# Patient Record
Sex: Female | Born: 1970 | Race: White | Hispanic: No | Marital: Married | Smoking: Former smoker
Health system: Southern US, Community
[De-identification: ages and names within clinical notes are randomized; demographics above are authoritative.]

## PROBLEM LIST (undated history)

## (undated) DIAGNOSIS — K529 Noninfective gastroenteritis and colitis, unspecified: Secondary | ICD-10-CM

## (undated) DIAGNOSIS — K8020 Calculus of gallbladder without cholecystitis without obstruction: Secondary | ICD-10-CM

## (undated) DIAGNOSIS — K219 Gastro-esophageal reflux disease without esophagitis: Secondary | ICD-10-CM

## (undated) DIAGNOSIS — G9332 Myalgic encephalomyelitis/chronic fatigue syndrome: Secondary | ICD-10-CM

## (undated) DIAGNOSIS — R7989 Other specified abnormal findings of blood chemistry: Secondary | ICD-10-CM

## (undated) DIAGNOSIS — Z8619 Personal history of other infectious and parasitic diseases: Secondary | ICD-10-CM

## (undated) DIAGNOSIS — F509 Eating disorder, unspecified: Secondary | ICD-10-CM

## (undated) DIAGNOSIS — F329 Major depressive disorder, single episode, unspecified: Secondary | ICD-10-CM

## (undated) DIAGNOSIS — F32A Depression, unspecified: Secondary | ICD-10-CM

## (undated) DIAGNOSIS — K921 Melena: Secondary | ICD-10-CM

## (undated) DIAGNOSIS — J302 Other seasonal allergic rhinitis: Secondary | ICD-10-CM

## (undated) DIAGNOSIS — Z720 Tobacco use: Secondary | ICD-10-CM

## (undated) DIAGNOSIS — K625 Hemorrhage of anus and rectum: Secondary | ICD-10-CM

## (undated) DIAGNOSIS — R5382 Chronic fatigue, unspecified: Secondary | ICD-10-CM

## (undated) DIAGNOSIS — E2839 Other primary ovarian failure: Secondary | ICD-10-CM

## (undated) DIAGNOSIS — F411 Generalized anxiety disorder: Secondary | ICD-10-CM

## (undated) DIAGNOSIS — D369 Benign neoplasm, unspecified site: Secondary | ICD-10-CM

## (undated) DIAGNOSIS — F319 Bipolar disorder, unspecified: Secondary | ICD-10-CM

## (undated) HISTORY — DX: Benign neoplasm, unspecified site: D36.9

## (undated) HISTORY — DX: Hemorrhage of anus and rectum: K62.5

## (undated) HISTORY — PX: BARTHOLIN GLAND CYST EXCISION: SHX565

## (undated) HISTORY — DX: Other primary ovarian failure: E28.39

## (undated) HISTORY — DX: Bipolar disorder, unspecified: F31.9

## (undated) HISTORY — DX: Tobacco use: Z72.0

## (undated) HISTORY — DX: Myalgic encephalomyelitis/chronic fatigue syndrome: G93.32

## (undated) HISTORY — PX: ABDOMINOPLASTY: SUR9

## (undated) HISTORY — DX: Personal history of other infectious and parasitic diseases: Z86.19

## (undated) HISTORY — DX: Melena: K92.1

## (undated) HISTORY — PX: ABDOMINAL HYSTERECTOMY: SHX81

## (undated) HISTORY — DX: Other specified abnormal findings of blood chemistry: R79.89

## (undated) HISTORY — PX: TONSILLECTOMY: SUR1361

## (undated) HISTORY — DX: Generalized anxiety disorder: F41.1

## (undated) HISTORY — DX: Depression, unspecified: F32.A

## (undated) HISTORY — DX: Chronic fatigue, unspecified: R53.82

## (undated) HISTORY — DX: Other seasonal allergic rhinitis: J30.2

## (undated) HISTORY — DX: Gastro-esophageal reflux disease without esophagitis: K21.9

## (undated) HISTORY — DX: Major depressive disorder, single episode, unspecified: F32.9

## (undated) HISTORY — DX: Eating disorder, unspecified: F50.9

---

## 1999-04-14 ENCOUNTER — Encounter: Payer: Self-pay | Admitting: Family Medicine

## 1999-04-14 ENCOUNTER — Emergency Department (HOSPITAL_COMMUNITY): Admission: EM | Admit: 1999-04-14 | Discharge: 1999-04-14 | Payer: Self-pay | Admitting: Emergency Medicine

## 1999-09-16 ENCOUNTER — Encounter: Admission: RE | Admit: 1999-09-16 | Discharge: 1999-09-16 | Payer: Self-pay | Admitting: *Deleted

## 1999-10-07 ENCOUNTER — Emergency Department (HOSPITAL_COMMUNITY): Admission: EM | Admit: 1999-10-07 | Discharge: 1999-10-07 | Payer: Self-pay | Admitting: *Deleted

## 2000-02-08 ENCOUNTER — Inpatient Hospital Stay (HOSPITAL_COMMUNITY): Admission: EM | Admit: 2000-02-08 | Discharge: 2000-02-14 | Payer: Self-pay | Admitting: Psychiatry

## 2002-04-01 ENCOUNTER — Ambulatory Visit (HOSPITAL_COMMUNITY): Admission: RE | Admit: 2002-04-01 | Discharge: 2002-04-01 | Payer: Self-pay

## 2002-04-01 ENCOUNTER — Encounter (INDEPENDENT_AMBULATORY_CARE_PROVIDER_SITE_OTHER): Payer: Self-pay | Admitting: Specialist

## 2002-04-27 ENCOUNTER — Encounter (INDEPENDENT_AMBULATORY_CARE_PROVIDER_SITE_OTHER): Payer: Self-pay

## 2002-04-27 ENCOUNTER — Ambulatory Visit (HOSPITAL_COMMUNITY): Admission: AD | Admit: 2002-04-27 | Discharge: 2002-04-27 | Payer: Self-pay

## 2002-08-27 ENCOUNTER — Other Ambulatory Visit: Admission: RE | Admit: 2002-08-27 | Discharge: 2002-08-27 | Payer: Self-pay

## 2008-10-10 ENCOUNTER — Ambulatory Visit (HOSPITAL_COMMUNITY): Admission: RE | Admit: 2008-10-10 | Discharge: 2008-10-10 | Payer: Self-pay | Admitting: Gynecology

## 2008-10-23 ENCOUNTER — Inpatient Hospital Stay (HOSPITAL_COMMUNITY): Admission: RE | Admit: 2008-10-23 | Discharge: 2008-10-24 | Payer: Self-pay | Admitting: Obstetrics and Gynecology

## 2008-10-23 ENCOUNTER — Encounter (INDEPENDENT_AMBULATORY_CARE_PROVIDER_SITE_OTHER): Payer: Self-pay | Admitting: Obstetrics and Gynecology

## 2008-10-23 HISTORY — PX: ABDOMINAL HYSTERECTOMY: SHX81

## 2010-07-12 IMAGING — CT CT PELVIS W/ CM
3 of 5 series · 14 of 32 positions shown, 19 images · IV contrast ([ID] READICAT & [ID] OMNIP 300%)
Comparison: None

CT ABDOMEN

CLINICAL DATA: Indeterminate pelvic mass seen on office
ultrasound.  Diffuse abdominal and pelvic distention and pain.

CT ABDOMEN AND PELVIS WITH CONTRAST
TECHNIQUE: Multidetector CT imaging of the abdomen and pelvis was
performed using the standard protocol following bolus
administration of intravenous contrast.
Contrast: 100 ml 1mnipaque-C44 and oral contrast

[Series 2: abd pelvis · axial · 0.71mm/px · z∈[-436,-182]mm · 4 of 86 slices shown, 9 images]
[im 18/86  soft-tissue]
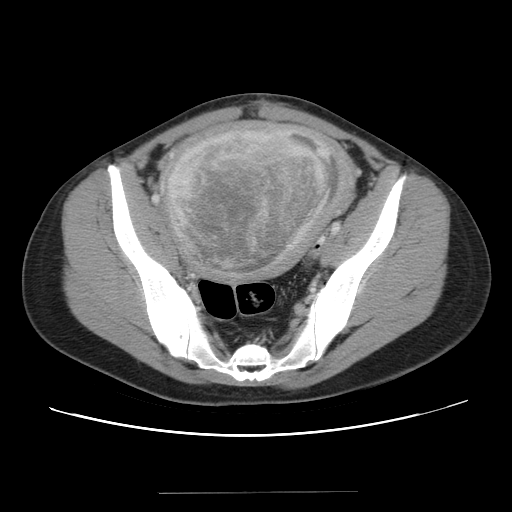
[im 18/86  lung]
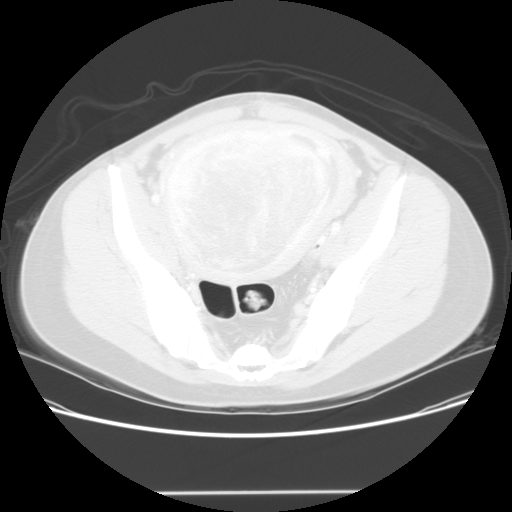
[im 18/86  bone]
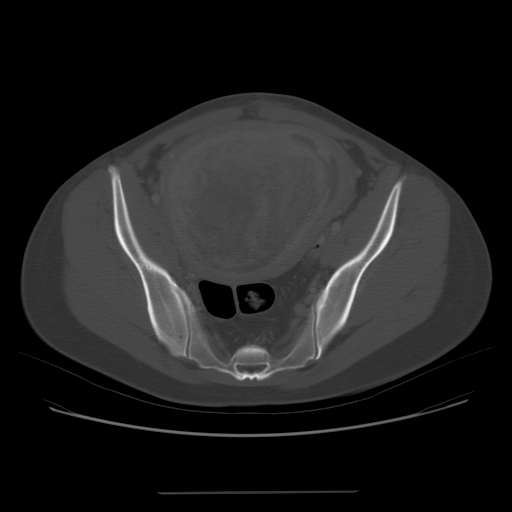
[im 35/86  soft-tissue]
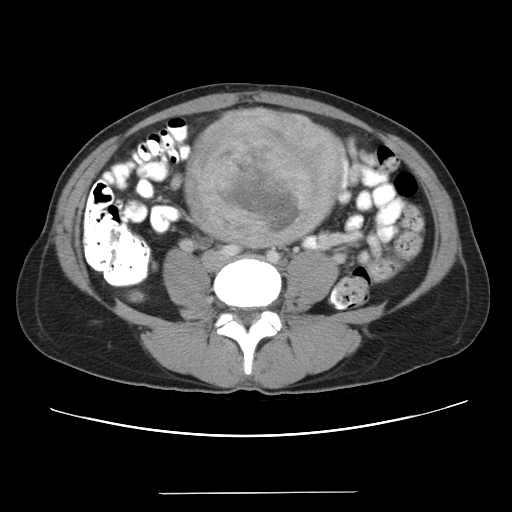
[im 35/86  lung]
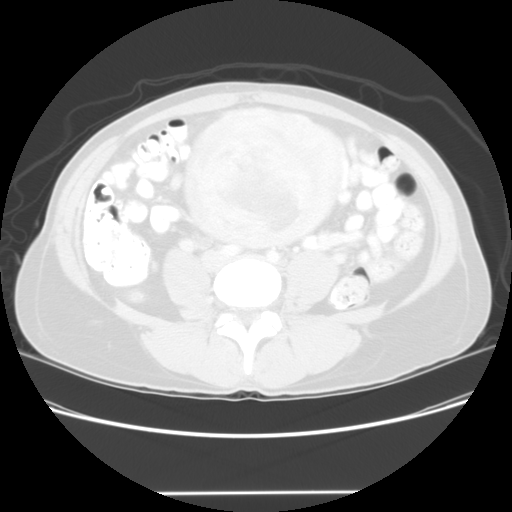
[im 52/86  soft-tissue]
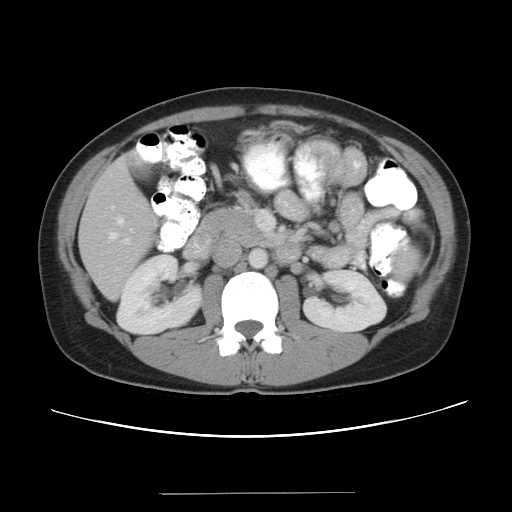
[im 52/86  lung]
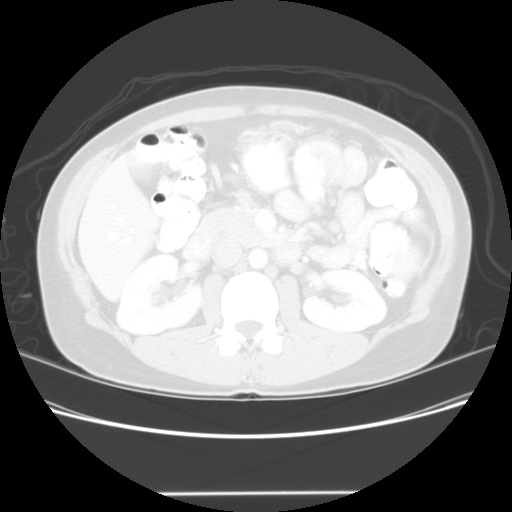
[im 69/86  soft-tissue]
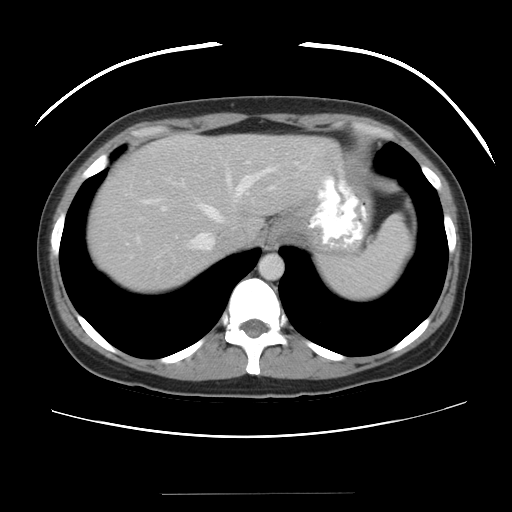
[im 69/86  lung]
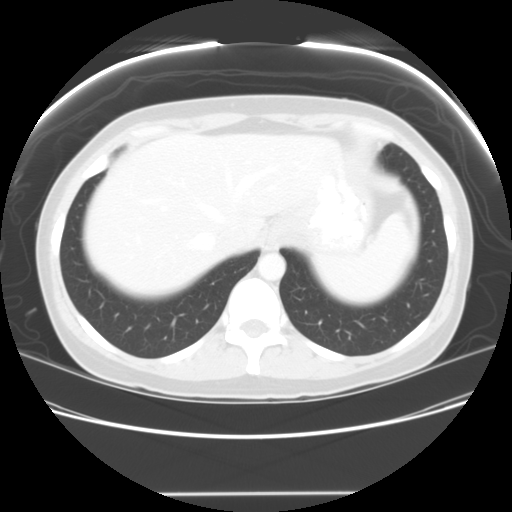

[Series 400: reformatted · coronal · 0.92mm/px · 2 of 131 slices shown (1 of 2)]
[im 15/131  soft-tissue]
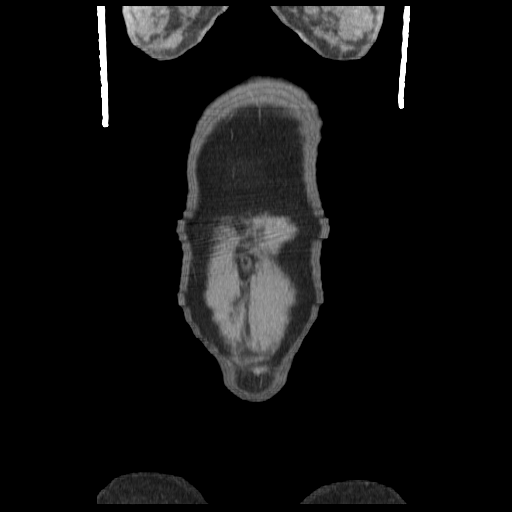
[im 29/131  soft-tissue]
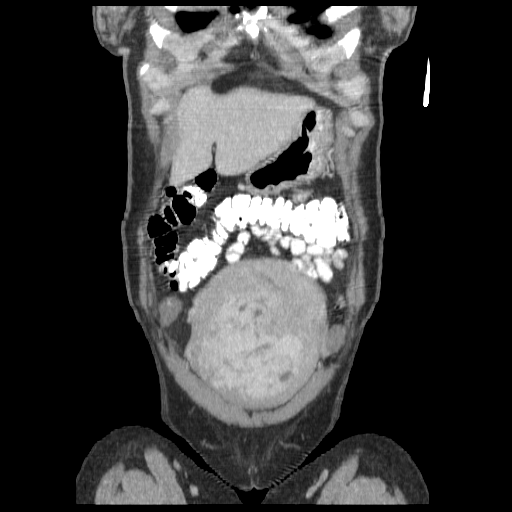

[Series 401: reformatted · sagittal · 0.92mm/px · 8 of 146 slices shown (2 of 2)]
[im 15/146  soft-tissue]
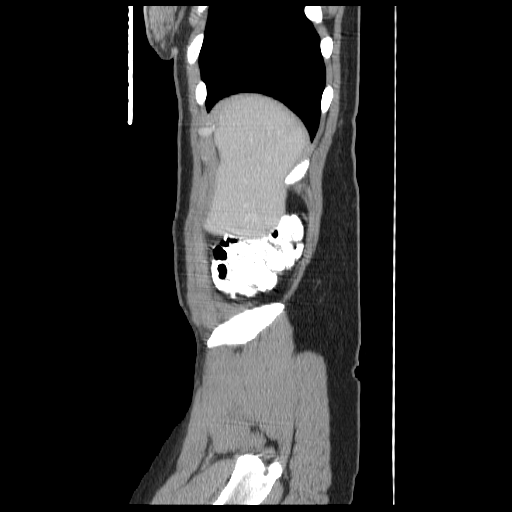
[im 30/146  soft-tissue]
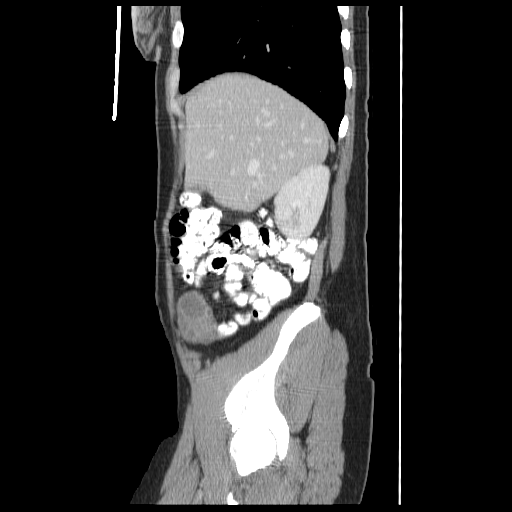
[im 44/146  soft-tissue]
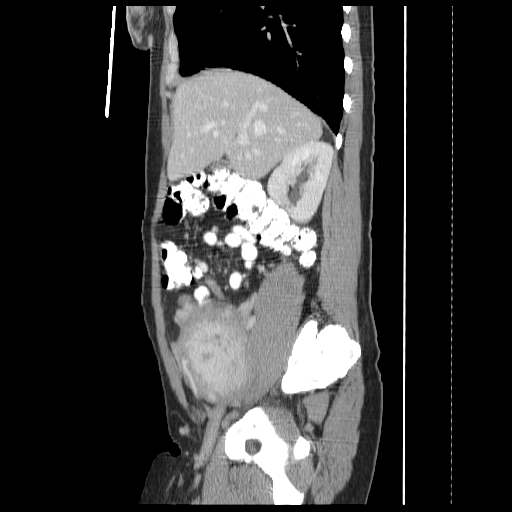
[im 59/146  soft-tissue]
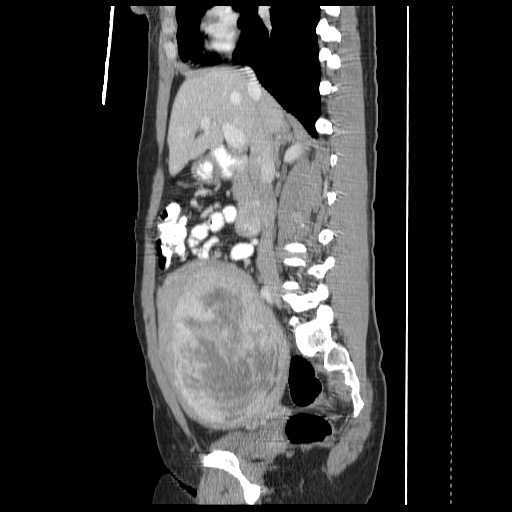
[im 88/146  soft-tissue]
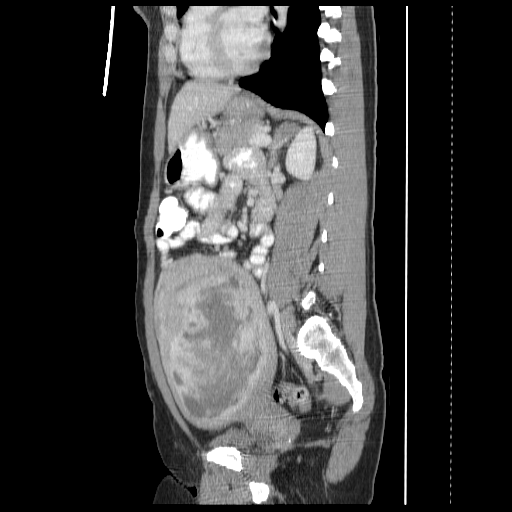
[im 102/146  soft-tissue]
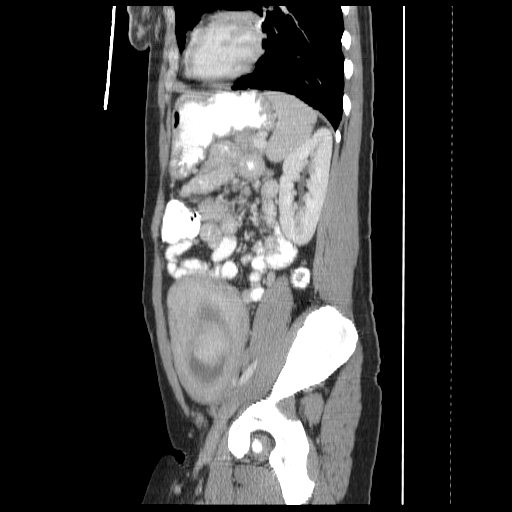
[im 117/146  soft-tissue]
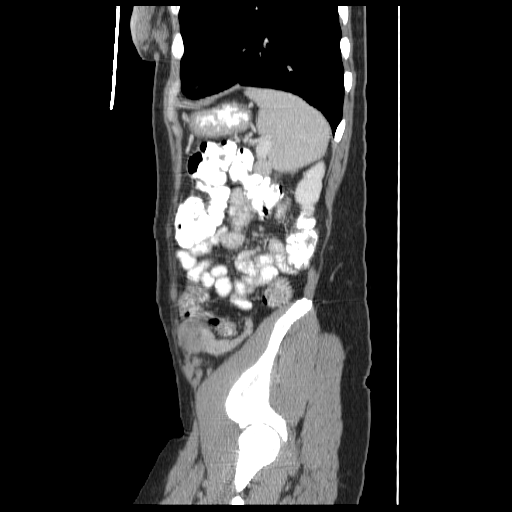
[im 131/146  soft-tissue]
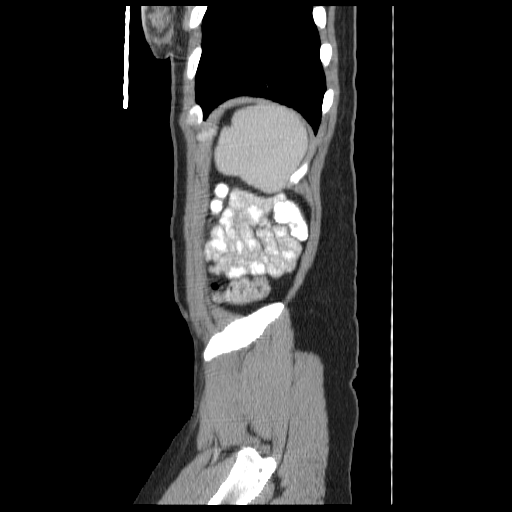

[14 of 32 positions shown; findings below may reference images not displayed]

FINDINGS: The abdominal parenchymal organs are normal in
appearance.  There is no evidence of hydronephrosis.  Small
calcified gallstones are seen, however there is no evidence of
acute cholecystitis or biliary ductal dilatation.

No abdominal soft tissue masses are identified.  No adenopathy is
seen within the abdomen.  There is no evidence of inflammatory
process or abnormal fluid collections.
IMPRESSION: 1.  No acute findings within the upper abdomen.
2.  Cholelithiasis.

CT PELVIS
FINDINGS: A large heterogeneously enhancing mass is seen within
the central uterus.  This measures 12.7 x 11.1 cm.  This could
represent a large submucosal fibroid versus endometrial mass.

A smaller low attenuation mass is seen in the anterior myometrial
wall of the uterine body measuring 2.8 cm, consistent with a small
intramural fibroid.

A 2.7 cm simple right ovarian cyst is seen, however ovaries
otherwise unremarkable appearance.  There is no evidence of pelvic
lymphadenopathy.  There is no evidence of inflammatory process or
ascites.  There is no evidence of dilated bowel loops.
IMPRESSION: 1.  12.7 cm heterogeneously enhancing mass within the central
uterus.  Differential diagnosis includes large submucosal fibroid
versus endometrial mass.  Pelvic MRI has greater soft tissue
contrast and specificity for GYN organs than CT, and could be
performed for further evaluation if desired.
2.  2.7 cm simple right ovarian cyst.
3.  No evidence of pelvic lymphadenopathy or ascites.

## 2010-10-13 LAB — BASIC METABOLIC PANEL
BUN: 9 mg/dL (ref 6–23)
CO2: 26 mEq/L (ref 19–32)
Calcium: 8.7 mg/dL (ref 8.4–10.5)
Chloride: 100 mEq/L (ref 96–112)
Creatinine, Ser: 0.83 mg/dL (ref 0.4–1.2)
GFR calc Af Amer: >60 mL/min (ref >60)
GFR calc non Af Amer: >60 mL/min (ref >60)
Glucose, Bld: 87 mg/dL (ref 70–99)
Potassium: 3.5 mEq/L (ref 3.5–5.1)
Sodium: 134 mEq/L — ABNORMAL LOW (ref 135–145)

## 2010-10-13 LAB — CBC
HCT: 17.1 % — ABNORMAL LOW (ref 36.0–46.0)
HCT: 26.6 % — ABNORMAL LOW (ref 36.0–46.0)
Hemoglobin: 5.8 g/dL — CL (ref 12.0–15.0)
Hemoglobin: 8.9 g/dL — ABNORMAL LOW (ref 12.0–15.0)
MCHC: 33.7 g/dL (ref 30.0–36.0)
MCV: 78.9 fL (ref 78.0–100.0)
MCV: 79.2 fL (ref 78.0–100.0)
RBC: 3.35 MIL/uL — ABNORMAL LOW (ref 3.87–5.11)
RDW: 14.9 % (ref 11.5–15.5)
WBC: 5.4 10*3/uL (ref 4.0–10.5)

## 2010-11-16 NOTE — Op Note (Signed)
NAME:  Samantha Lyons, Samantha Lyons             ACCOUNT NO.:  [id number]   MEDICAL RECORD NO.:  [id number]          PATIENT TYPE:  INP   LOCATION:  9306                          [facility]ACILITY:  [facility]   PHYSICIAN:  Michelle L. Grewal, M.D.DATE OF BIRTH:  1970-07-21   DATE OF PROCEDURE:  10/23/2008  DATE OF DISCHARGE:                               OPERATIVE REPORT   PREOPERATIVE DIAGNOSES:  Pelvic mass and anemia.   POSTOPERATIVE DIAGNOSES:  Fibroids and anemia.   PROCEDURE:  Total abdominal hysterectomy and bilateral salpingo-  oophorectomy.   SURGEON:  Michelle L. Vincente Poli, MD   ASSISTANT:  Zelphia Cairo, MD   ANESTHESIA:  General.   ESTIMATED BLOOD LOSS:  300 mL.   DRAINS:  Foley catheter.   COMPLICATIONS:  None.   PATHOLOGY:  Uterus, cervix, bilateral fallopian tubes, and bilateral  ovaries.   PROCEDURE:  The patient is a 40 year old patient who was consented in  the office after a pelvic mass was found on exam.  She was counseled  that she needed a TAH possible BSO.  The patient elected to have a BSO  at the time of surgery.  She had been counseled on the risk of surgery  which include infection, bleeding, anesthesia, pulmonary embolism, deep  venous thromboembolism, necessity for blood transfusion, or injury to  internal organs, she agreed to proceed with the surgery.  Her hemoglobin  preop was 8.9 and she was advised she may need 1-2 packed units of cells  after surgery depending on how she tolerates a lower hemoglobin and  depending on the blood loss at the time of surgery, she had agreed to  possible blood transfusion.  The patient was intubated without  difficulty.  She was then prepped and draped in the usual sterile  fashion.  A Foley catheter was inserted and draining clear urine.  Exam  under anesthesia revealed a mass that extended above the umbilicus, she  had a previous abdominoplasty scar.  A scalpel was used to make a low  incision and I used the exact same incision as the  abdominal fossa, the  scrub did not extend it to full width, it was carried down to the  fascia, the fascia was scored in the midline extended laterally.  The  rectus muscles were separated in the midline.  The peritoneum was  entered bluntly.  It was noted that she had a large uterus with a large  fibroid.  Her ovaries were normal.  The uterus was then pulled up  through the incision.  We then started with the hysterectomy.  Then, I  placed curved Heaney clamps just beneath the right ovary across the  infundibular pelvic ligament.  The ureter was lay low in the pelvis.  The pedicles cut and suture ligated using 0 Vicryl suture.  We then  placed a curved Heaney clamps just along the broad ligament.  There was  a large fibroid that was extended and out into the right side of the  broad ligament and each pedicle was clamped, cut, suture ligated using 0  Vicryl suture. We then identified the round ligament on the  right side  that was suture ligated using 0 Vicryl suture and then the bladder flap  was created without difficulty using sharp and blunt dissection.  A  curved Heaney clamp was then placed across the remainder of the broad  ligament across to the uterine artery at the level of the internal os.  The pedicle was clamped, cut, and suture ligated using 0 Vicryl suture.  This was done identically on the left side as well.  At this point, once  we reached the level of the internal os on both sides, we then amputated  the large uterus, so we could see the cervix.  This was done without  difficulty.  Then, we placed a self-retaining retractor in the abdominal  cavity and placed laparotomy pads in the upper abdomen.  We then grasped  the cervix using a Kocher clamp.  We then further developed the bladder  flap which was easily done in the cervix, this was very short.  I placed  a straight Heaney clamp and then a curved Heaney clamps and that reached  the external os.  Each pedicle was  clamped, cut, and suture ligated  using 0 Vicryl suture.  This was done identically on the left side as  well.  The cervix was then removed, the vaginal cuff was closed with 3  figure-of-eights using 0 Vicryl suture.  Irrigation was performed.  Hemostasis at all pedicle sites were excellent.  All laparotomy pads and  retractors were removed without difficulty.  The peritoneum was closed  using 0 Vicryl in a running stitch.  The  rectus muscles were  reapproximated using the same 0 Vicryl.  The fascia was closed using 0  Vicryl in a running stitch.  After irrigation of subcutaneous layer, the  skin was closed with staples.  All sponge, lap, and instrument counts  were correct x2.  The patient went to recovery room in stable condition.      Michelle L. Vincente Poli, M.D.  Electronically Signed     MLG/MEDQ  D:  10/23/2008  T:  10/23/2008  Job:  [id number]

## 2010-11-16 NOTE — H&P (Signed)
NAME:  Samantha Lyons, Samantha Lyons             ACCOUNT NO.:  [id number]   MEDICAL RECORD NO.:  [id number]          PATIENT TYPE:  AMB   LOCATION:  SDC                           [facility]ILITY:  [facility]   PHYSICIAN:  Michelle L. Grewal, M.D.DATE OF BIRTH:  Nov 22, 1970   DATE OF ADMISSION:  DATE OF DISCHARGE:                              HISTORY & PHYSICAL   Date of surgery is October 23, 2008   HISTORY OF PRESENT ILLNESS:  The patient is a 40 year old gravida 2,  para 2, LMP 10/11/2008, presents today for hysterectomy.  She has a known  pelvic mass that has been present for at least 4 years.  She has not  been sexually active for 2 years.  She has a history of cervical  dysplasia at age 35.  She has not had any GYN care for about 10 years.  She has noticed this enlarging pelvic mass, since birth of her twins.  She had abdominoplasty 6 years ago.  She denies any abnormal bleeding,  any nausea or vomiting.  CT scan was performed and ordered by Dr. Chevis Pretty  on October 10, 2008 that showed a large heterogeneous enhancing mass seen  within the central uterus.  It measured 12.7 x 11.1 cm.  It could  represent a large submucosal fibroid versus endometrial mass.  A smaller  mass is seen in the anterior myometrial wall of the uterine body  measuring 2.8 cm consistent with a small intramural fibroid.  Ovaries  were unremarkable.  There was no pelvic lymphadenopathy.  No evidence of  inflammatory process or ascites and no evidence of dilated bowel loops.  The patient was counseled by Dr. Chevis Pretty and then by myself with the need  for hysterectomy.  She wants to proceed.   PAST MEDICAL HISTORY:  She is unremarkable.  She has a history of  abnormal Pap smear and urinary tract infections.   SURGICAL HISTORY:  Abdominoplasty and delivery of twins.   MEDICATIONS:  She is not on any medications.   ALLERGIES:  She has no known allergies.   SOCIAL HISTORY:  She does smoke a one-half pack per day.  She does drink  1-2 alcoholic  drinks per week.  She is divorced and she has not been  sexually active for about 2 years.   PHYSICAL EXAMINATION:  VITAL SIGNS:  Weight 144 and BP 104/68.  LUNGS:  Clear to auscultation bilaterally.  CARDIAC:  Regular rate and rhythm.  BREASTS:  Soft, nontender, and no masses.  ABDOMEN:  She is very thin and there is a palpable protuberant mass to  the umbilicus and it is very firm to palpation.  PELVIC:  Deferred.   IMPRESSION:  Pelvic mass.   PLAN:  I do recommend a total abdominal hysterectomy.  She of course,  had a hemoglobin on October 06, 2008 that was 9.5 and her Pap smear on  October 06, 2008 is within normal limits.  At the same time, she wishes to  have a BSO because of strong family history of endometriosis.  She is  well aware of the potential need for hormone therapy postop.  She was  counseled by myself as well as by Dr. Chevis Pretty to separate visit with a  known risk associated with  hysterectomy such as risks to injury to internal organs, risk of  anesthesia, risk of infection, risk of bleeding, and risk of possible  need for a blood transfusion and possible risk of pulmonary embolism and  deep venous thromboembolism.  We will proceed with a TAH-BSO.      Michelle L. Vincente Poli, M.D.  Electronically Signed     MLG/MEDQ  D:  10/22/2008  T:  10/22/2008  Job:  [id number]

## 2010-11-19 NOTE — Op Note (Signed)
   NAME:  Samantha Lyons, Samantha Lyons                       ACCOUNT NO.:  [id number]   MEDICAL RECORD NO.:  [id number]                   PATIENT TYPE:  AMB   LOCATION:  SDC                                  [facility]   PHYSICIAN:  James A. Ashley Royalty, M.D.             DATE OF BIRTH:  20-Jul-1970   DATE OF PROCEDURE:  DATE OF DISCHARGE:  04/27/2002                                 OPERATIVE REPORT   PREOPERATIVE DIAGNOSES:  Inevitable abortion.   POSTOPERATIVE DIAGNOSES:  Inevitable abortion.   PATHOLOGY:  Pending.   PROCEDURE:  Suction dilatation and curettage.   SURGEON:  Rudy Jew. Ashley Royalty, M.D.   ANESTHESIA:  Monitored anesthesia care with 1% Xylocaine paracervical block  (20 cc).   FINDINGS:  Uterus sounded to approximately 9 cm.  A moderate amount of  apparent products of conception was obtained.   ESTIMATED BLOOD LOSS:  75 cc.   COMPLICATIONS:  None.   PACKS AND DRAINS:  None.   TYPE AND RH:  A+.   PROCEDURE:  The patient was taken to the operating room and placed in the  dorsal supine position.  After adequate IV sedation was administered, she  was placed in the lithotomy position and prepped and draped in the usual  manner for vaginal surgery.  Posterior weighted retractor was placed per  vagina.  The anterior lip of the cervix was grasped with a single tooth  tenaculum.  20 cc of 1% Xylocaine was instilled into the cervix to create a  paracervical block.  The uterus was then gently sounded to approximately 9  cm.  The uterus was verified as already being dilated to 29 French diameter  using 29 French dilator.  A 9 mm suction curette was introduced into the  uterine cavity.  Suction was applied.  A moderate amount of apparent  products of conception was delivered through the tubing.  After several  passes with the suction curette, no additional tissue was obtained.  At this  point gentle sharp curettage was performed.  Careful attention was paid to  avoid over zealous  curettage.  One additional pass with the  suction curette yielded no additional tissue.  At this point the procedure  was terminated.  The vaginal instruments were removed.  Hemostasis was  noted.  The patient was taken to the recovery room in excellent condition.  Type and Rh A+.                                               James A. Ashley Royalty, M.D.    JAM/MEDQ  D:  04/27/2002  T:  04/29/2002  Job:  [id number]   cc:   Ronda Fairly. Galen Daft, M.D.

## 2010-11-19 NOTE — H&P (Signed)
[facility]  Patient:    Samantha Lyons, Samantha Lyons                     MRN: [id number] Adm. Date:  [id number] Attending:  Denny Peon                   Psychiatric Admission Assessment  INTRODUCTION:  Samantha Lyons is a 40 year old white single female mother of 35-month-old twins, who at present, lives with parents.  PRESENTING PROBLEMS:  Patient was committed by her father after allegedly expressing suicidal idea, throwing angry fits, punching walls, and pushing him.  According to patient, commitment was parents way to prevent her from going to [place] to see the father of her twins, whom she hopes to persuade to help her with child support.  Unfortunately, the _________ shows that it was an example of very poor judgment, since the man does not care about her children, lives with another woman, and is involved in a pretty hostile environment.  Parents wanted to protect the patients children from being exposed to unreasonably long trip in the car and from being exposed to possible violence in [place].  Patient is supposed to be seen recently by the local mental health center, but she tends to miss appointments.  She was on some medication prescribed by emergency room doctor, Dr. Katrinka Blazing, which included Klonopin at bedtime, Seroquel 25 mg at bedtime, and Atarol.  She was taking 5 mg of Atarol in the morning.  Patient reports sleeping 4-6 hours, denies racing pulse, denies hallucinations, delusions, or dangerous ideations. Her behavior was, however, somewhat erratic, speech fast and pressured, and she seemed to be on the edge of breaking down.  She admitted that her parents get on her nerves, but she told me that she can stay totally composed. Patient has history of several hospitalizations in the past, last time at [place] two and a half years ago, discharged with diagnosis of bipolar disorder on Depakote, but never followed.  She was previously seen on  outpatient basis by Dr. Jodi Marble with diagnosis of ADD, panic disorder, and sleep disorder.  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  FAMILY HISTORY:  Negative for mental problems.  SUBSTANCE ABUSE:  Patient smokes marijuana daily and smokes cigarettes.  She denies any stronger drugs or alcohol.  MEDICAL PROBLEMS:  Last menstrual period was three days ago.  She does not take birth control pills, but denied being sexually active.  PHYSICAL EXAMINATION:  Physical examination in emergency room was normal three days ago.  MENTAL STATUS EXAMINATION:  Patient is thin built white female with fair personal hygiene, good eye contact, somehow hyper in her behavior, restless, but cooperative.  She has a hard time paying attention.  Speech was fast but not pressured.  Mood was okay, complains of being anxious over being here. _________ were moving with fast pace with frequent circumstantial digression, requiring redirecting.  Attention span was grossly disturbed.  No dangerous ideations, no delusions, no obsession or compulsions.  Patient was alert, oriented x 3, with fair memory, but grossly decreased concentration.  Her insight was poor, judgment disturbed, and intellectual functioning on average range.  DIAGNOSTIC IMPRESSION: Axis I:    1. Bipolar disorder, not otherwise specified.            2. Attention deficit disorder.  3. Sleep disorder, not otherwise specified.            4. Marijuana abuse. Axis II:   Personality disorder, not otherwise specified. Axis III:  No diagnosis. Axis IV:   Moderate stressors. Axis V:    GAF at the time of admission 14, discharge 55.  PLAN:  Will include meeting with patients family and friend, starting her on mood stabilizer, and offering patient voluntary papers.  After stable, she could be satisfactorily discharged to  community, likely on outpatient commitment.  At this point she agrees with the plan.  DD:  02/10/00 TD:  02/12/00 Job: [id number]

## 2010-11-19 NOTE — Op Note (Signed)
   NAME:  Samantha Lyons, Samantha Lyons                       ACCOUNT NO.:  [id number]   MEDICAL RECORD NO.:  [id number]                   PATIENT TYPE:  AMB   LOCATION:  SDC                                  [facility]   PHYSICIAN:  Ronda Fairly. Galen Daft, M.D.              DATE OF BIRTH:  11/18/1970   DATE OF PROCEDURE:  04/01/2002  DATE OF DISCHARGE:                                 OPERATIVE REPORT   PREOPERATIVE DIAGNOSIS:  Recurrent Bartholin's cyst, left vulva.   POSTOPERATIVE DIAGNOSIS:  Recurrent Bartholin's cyst, left vulva.   PROCEDURE:  Excision of left Bartholin's cyst.   SURGEON:  Ronda Fairly. Galen Daft, M.D.   ANESTHESIA:  General.   COMPLICATIONS:  None.   ESTIMATED BLOOD LOSS:  Less than 50 cc.   DESCRIPTION OF PROCEDURE:  The patient was identified positively, and we  obtained informed consent.  She understood the risks of the procedure, the  risk of recurrence, and the alternatives of the procedure.  The patient had  a Bartholin's cyst drained approximately a week and a half ago in the office  setting.  The patient had Betadine prep and sterile technique.  The bladder  was emptied prior to getting into the operating room.  The patient had a  suture at the prior surgery.  This was removed, and this was at the pore of  the Bartholin's cyst.  The Bartholin's cyst was grasped with an Allis clamp  and dissected away from the surrounding cutaneous tissues and surrounding  fascial tissues.  The area was hemostatic after use of monopolar cautery.  Suture was utilized to marsupialize the cyst defect and to reduce any risk  for recurrence.  This was Vicryl and Monocryl in select location.  There was  no bleeding at the end of the procedure.  The patient tolerated the  procedure quite well, left the operating room in stable condition.  All  instrument, sponge, and needle counts were correct at the end of the case.  She was discharged home with Percocet, sitz baths, and follow-up  instructions for the office and activity limits.                                               Ronda Fairly. Galen Daft, M.D.    NJT/MEDQ  D:  04/01/2002  T:  04/02/2002  Job:  [id number]   cc:   Dario Guardian, M.D.

## 2011-10-04 ENCOUNTER — Ambulatory Visit (INDEPENDENT_AMBULATORY_CARE_PROVIDER_SITE_OTHER): Admitting: Internal Medicine

## 2011-10-04 ENCOUNTER — Encounter: Payer: Self-pay | Admitting: Internal Medicine

## 2011-10-04 VITALS — BP 110/70 | HR 78 | Temp 97.6°F | Ht 66.0 in | Wt 174.0 lb

## 2011-10-04 DIAGNOSIS — F419 Anxiety disorder, unspecified: Secondary | ICD-10-CM

## 2011-10-04 DIAGNOSIS — Z720 Tobacco use: Secondary | ICD-10-CM | POA: Insufficient documentation

## 2011-10-04 DIAGNOSIS — F411 Generalized anxiety disorder: Secondary | ICD-10-CM

## 2011-10-04 DIAGNOSIS — F329 Major depressive disorder, single episode, unspecified: Secondary | ICD-10-CM | POA: Insufficient documentation

## 2011-10-04 DIAGNOSIS — N809 Endometriosis, unspecified: Secondary | ICD-10-CM | POA: Insufficient documentation

## 2011-10-04 LAB — COMPREHENSIVE METABOLIC PANEL
ALT: 17 U/L (ref 0–35)
Alkaline Phosphatase: 47 U/L (ref 39–117)
Creat: 0.98 mg/dL (ref 0.50–1.10)
Glucose, Bld: 74 mg/dL (ref 70–99)
Sodium: 140 mEq/L (ref 135–145)
Total Bilirubin: 0.3 mg/dL (ref 0.3–1.2)
Total Protein: 6.5 g/dL (ref 6.0–8.3)

## 2011-10-04 LAB — LIPID PANEL
LDL Cholesterol: 47 mg/dL (ref 0–99)
Total CHOL/HDL Ratio: 2.9 Ratio
Triglycerides: 307 mg/dL — ABNORMAL HIGH (ref <150)
VLDL: 61 mg/dL — ABNORMAL HIGH (ref 0–40)

## 2011-10-04 MED ORDER — ESCITALOPRAM OXALATE 10 MG PO TABS: 10.0000 mg | ORAL_TABLET | Freq: Every day | ORAL | Status: DC

## 2011-10-04 NOTE — Patient Instructions (Signed)
Call for appointment with psychiatrist Dr. Evelene Croon:  [phone]                                                              Dr. Nolen Mu:  [phone]                                                               Dr. Christell Constant:   [phone]  Schedule CPe with me  Labs will be mailed to you

## 2011-10-04 NOTE — Progress Notes (Signed)
Subjective:    Patient ID: Samantha Lyons, female    DOB: 24-Jul-1970, 41 y.o.   MRN: [id number]  HPI New pt here for first vsiit.  Complicated psychiatric history with ADHD pt reports diagnosed at age 69, anxiety, depression.  She had seen Dr. Jodi Marble in the past but did not like the meds she was on.  Dr. Eustaquio Boyden has been prescribing her Lexapro and WEllbutrin.  She states Wellbutrin has been making her nauseated if she takes it twice a day so only takes it once a day.    Other PMH of endometriosis, ovarian cyst and tobacco use.  She is S/P hysterectomy and bilateral S and O  She has lots of stressors currently .  Husband at camp [place]. She is single mother ot 5 children, 3 of them are 35 years old and have Asperger's and ADHD and pt states BAD.  She has very little help with kids.    Has not seen a psychiatrist or therapist in  quite some time.  Currently she describes mixed anxiety and depression.  No SI or HI no psychotic features. She has been on multiple meds. But cannot tolerate bid Wellbutrin  No Known Allergies Past Medical History  Diagnosis Date  . Anxiety   . Depression   . Endometriosis   . Tobacco use    Past Surgical History  Procedure Date  . Abdominal hysterectomy 10/23/08   History   Social History  . Marital Status: Married    Spouse Name: N/A    Number of Children: N/A  . Years of Education: N/A   Occupational History  . Not on file.   Social History Main Topics  . Smoking status: Current Everyday Smoker -- 0.2 packs/day  . Smokeless tobacco: Not on file  . Alcohol Use: Yes     wine occassionally  . Drug Use: No  . Sexually Active: Yes    Birth Control/ Protection: Surgical   Other Topics Concern  . Not on file   Social History Narrative  . No narrative on file   Family History  Problem Relation Age of Onset  . Osteoporosis Mother   . Heart disease Father   . Osteoporosis Father    Patient Active Problem List  Diagnoses  . Anxiety  .  Depression  . Endometriosis  . Tobacco use   Current Outpatient Prescriptions on File Prior to Visit  Medication Sig Dispense Refill  . buPROPion (WELLBUTRIN SR) 150 MG 12 hr tablet Take 150 mg by mouth 2 (two) times daily.      . drospirenone-estradiol (ANGELIQ) 0.5-1 MG per tablet Take 1 tablet by mouth 2 (two) times daily.      Marland Kitchen escitalopram (LEXAPRO) 10 MG tablet Take 1 tablet (10 mg total) by mouth daily.  30 tablet  1        Review of Systems See HPI    Objective:   Physical Exam Physical Exam  Nursing note and vitals reviewed.  Constitutional: She is oriented to person, place, and time. She appears well-developed and well-nourished.  HENT:  Head: Normocephalic and atraumatic.  Cardiovascular: Normal rate and regular rhythm. Exam reveals no gallop and no friction rub.  No murmur heard.  Pulmonary/Chest: Breath sounds normal. She has no wheezes. She has no rales.  Neurological: She is alert and oriented to person, place, and time.  Skin: Skin is warm and dry.  Psychiatric: She has a normal mood and affect. Her behavior is normal.  Assessment & Plan:  1)  Anxiety vs depression with ADHD as child. She has complicated psychiatric history and I counseled pt. Of the importance of having a psychiatric evaluation and follow up as she will likely need multiple meds.  Check chemistries, TSH todAY  I gave her the office number to Dr. Evelene Croon, Dr. Nolen Mu and Dr. Christell Constant and she is to make appt. For diagnostic and manageament.  I will refill her Lexapro for another month.  She voices understanding.  Would also benefit from talking therapy and I will let psychiatrist refer.  2)  Tobacco use  Not interested in cessation now.   Counseledling given 3)  Hyperlipidemia  wil check today

## 2011-10-05 LAB — CBC WITH DIFFERENTIAL/PLATELET
Basophils Relative: 0 % (ref 0–1)
Eosinophils Absolute: 0.2 10*3/uL (ref 0.0–0.7)
Eosinophils Relative: 2 % (ref 0–5)
MCH: 29.2 pg (ref 26.0–34.0)
MCHC: 34 g/dL (ref 30.0–36.0)
Neutrophils Relative %: 65 % (ref 43–77)
Platelets: 240 10*3/uL (ref 150–400)

## 2011-10-11 ENCOUNTER — Telehealth: Payer: Self-pay | Admitting: Emergency Medicine

## 2011-10-11 NOTE — Telephone Encounter (Signed)
Labs mailed to pt's home address.

## 2011-12-07 ENCOUNTER — Encounter: Payer: Self-pay | Admitting: Internal Medicine

## 2011-12-07 ENCOUNTER — Ambulatory Visit (HOSPITAL_BASED_OUTPATIENT_CLINIC_OR_DEPARTMENT_OTHER)
Admission: RE | Admit: 2011-12-07 | Discharge: 2011-12-07 | Disposition: A | Discharge status: Home / Self Care | Source: Ambulatory Visit | Attending: Internal Medicine | Admitting: Internal Medicine

## 2011-12-07 ENCOUNTER — Ambulatory Visit (INDEPENDENT_AMBULATORY_CARE_PROVIDER_SITE_OTHER): Admitting: Internal Medicine

## 2011-12-07 VITALS — BP 118/74 | HR 72 | Temp 97.3°F | Resp 16 | Ht 65.0 in | Wt 173.0 lb

## 2011-12-07 DIAGNOSIS — J309 Allergic rhinitis, unspecified: Secondary | ICD-10-CM

## 2011-12-07 DIAGNOSIS — Z23 Encounter for immunization: Secondary | ICD-10-CM

## 2011-12-07 DIAGNOSIS — F419 Anxiety disorder, unspecified: Secondary | ICD-10-CM

## 2011-12-07 DIAGNOSIS — F411 Generalized anxiety disorder: Secondary | ICD-10-CM

## 2011-12-07 DIAGNOSIS — Z139 Encounter for screening, unspecified: Secondary | ICD-10-CM

## 2011-12-07 DIAGNOSIS — F329 Major depressive disorder, single episode, unspecified: Secondary | ICD-10-CM

## 2011-12-07 DIAGNOSIS — Z720 Tobacco use: Secondary | ICD-10-CM

## 2011-12-07 DIAGNOSIS — Z1231 Encounter for screening mammogram for malignant neoplasm of breast: Secondary | ICD-10-CM | POA: Insufficient documentation

## 2011-12-07 DIAGNOSIS — F17200 Nicotine dependence, unspecified, uncomplicated: Secondary | ICD-10-CM

## 2011-12-07 MED ORDER — TETANUS-DIPHTH-ACELL PERTUSSIS 5-2.5-18.5 LF-MCG/0.5 IM SUSP
0.5000 mL | Freq: Once | INTRAMUSCULAR | Status: AC
  Administered 2011-12-07: 0.5 mL via INTRAMUSCULAR

## 2011-12-07 MED ORDER — FLUTICASONE PROPIONATE 50 MCG/ACT NA SUSP: NASAL | Status: DC

## 2011-12-07 MED ORDER — LORATADINE-PSEUDOEPHEDRINE ER 10-240 MG PO TB24: 1.0000 | ORAL_TABLET | Freq: Every day | ORAL | Status: AC

## 2011-12-08 DIAGNOSIS — J309 Allergic rhinitis, unspecified: Secondary | ICD-10-CM | POA: Insufficient documentation

## 2011-12-08 NOTE — Progress Notes (Signed)
Subjective:    Patient ID: Samantha Lyons, female    DOB: 1970/11/24, 41 y.o.   MRN: [id number]  HPI  Anice is here for comprehensive eval.  Overall doing well except concerned about weight gain.   She under a lot of stress caring for 25 yo daughter that has ADHD and BAD and along with her other children.  Husband a marine and suffers from PTSD and anxiety.    She is S/P hysterectomy - see pathology all benign  She is past due for her mammogram.    Nose is frequenlty running from allergies. She sneezes a lot.  Currently using Zyrtec  She has not made appt with psychiatrist as yet.  Sheis  Still on low dose Lexapro and Wellbutrin  No Known Allergies Past Medical History  Diagnosis Date  . Anxiety   . Depression   . Endometriosis   . Tobacco use    Past Surgical History  Procedure Date  . Abdominal hysterectomy 10/23/08   History   Social History  . Marital Status: Married    Spouse Name: N/A    Number of Children: N/A  . Years of Education: N/A   Occupational History  . Not on file.   Social History Main Topics  . Smoking status: Current Everyday Smoker -- 0.2 packs/day  . Smokeless tobacco: Not on file  . Alcohol Use: Yes     wine occassionally  . Drug Use: No  . Sexually Active: Yes    Birth Control/ Protection: Surgical   Other Topics Concern  . Not on file   Social History Narrative  . No narrative on file   Family History  Problem Relation Age of Onset  . Osteoporosis Mother   . Heart disease Father   . Osteoporosis Father    Patient Active Problem List  Diagnoses  . Anxiety  . Depression  . Endometriosis  . Tobacco use   Current Outpatient Prescriptions on File Prior to Visit  Medication Sig Dispense Refill  . buPROPion (WELLBUTRIN SR) 150 MG 12 hr tablet Take 150 mg by mouth 2 (two) times daily.      . drospirenone-estradiol (ANGELIQ) 0.5-1 MG per tablet Take 1 tablet by mouth 2 (two) times daily.      Marland Kitchen escitalopram (LEXAPRO) 10 MG tablet  Take 1 tablet (10 mg total) by mouth daily.  30 tablet  1  . fluticasone (FLONASE) 50 MCG/ACT nasal spray Place one spray in each nostril daily  16 g  6   No current facility-administered medications on file prior to visit.      Review of Systems  Constitutional: Negative.   HENT: Positive for congestion, rhinorrhea, sneezing and postnasal drip. Negative for trouble swallowing and sinus pressure.   Eyes: Positive for itching.  Respiratory: Negative.   Cardiovascular: Negative.   Gastrointestinal: Negative.   Genitourinary: Negative.   Musculoskeletal: Negative.   Skin: Negative.   Neurological: Negative.   Hematological: Negative.   Psychiatric/Behavioral: Negative.        Objective:   Physical Exam Physical Exam  Nursing note and vitals reviewed.  Constitutional: She is oriented to person, place, and time. She appears well-developed and well-nourished.  HENT:  Head: Normocephalic and atraumatic.  Right Ear: Tympanic membrane serous effusionl. No drainage. Tympanic membrane is not injected and not erythematous.  Left Ear: Tympanic membrane serous effusion. No drainage. Tympanic membrane is not injected and not erythematous.  Nose: Nose normal. Right sinus exhibits no maxillary sinus tenderness and no  frontal sinus tenderness. Left sinus exhibits no maxillary sinus tenderness and no frontal sinus tenderness.  Mouth/Throat: Oropharynx is clear and moist. No oral lesions. No oropharyngeal exudate.  Eyes: Conjunctivae and EOM are normal. Pupils are equal, round, and reactive to light.  Neck: Normal range of motion. Neck supple. No JVD present. Carotid bruit is not present. No mass and no thyromegaly present.  Cardiovascular: Normal rate, regular rhythm, S1 normal, S2 normal and intact distal pulses. Exam reveals no gallop and no friction rub.  No murmur heard.  Pulses:  Carotid pulses are 2+ on the right side, and 2+ on the left side.  Dorsalis pedis pulses are 2+ on the right  side, and 2+ on the left side.  No carotid bruit. No LE edema  Pulmonary/Chest: Breath sounds normal. She has no wheezes. She has no rales. She exhibits no tenderness.  Breast: no nipple discharge no axillary adenopathy no discrete masses bilaterally Abdominal: Soft. Bowel sounds are normal. She exhibits no distension and no mass. There is no hepatosplenomegaly. There is no tenderness. There is no CVA tenderness.  Musculoskeletal: Normal range of motion.  No active synovitis to joints.  Lymphadenopathy:  She has no cervical adenopathy.  She has no axillary adenopathy.  Right: No inguinal and no supraclavicular adenopathy present.  Left: No inguinal and no supraclavicular adenopathy present.  Neurological: She is alert and oriented to person, place, and time. She has normal strength and normal reflexes. She displays no tremor. No cranial nerve deficit or sensory deficit. Coordination and gait normal.  Skin: Skin is warm and dry. No rash noted. No cyanosis. Nails show no clubbing.  Psychiatric: She has a normal mood and affect. Her speech is normal and behavior is normal. Cognition and memory are normal.           Assessment & Plan:  1)  HM  See scanned sheet .  Will get TDAP and Mammogram today  Allergic rhinitis:  Claritin D 24 hour one daily and flonase daily for the next 2 months  Anxiety/Depression  Advised again to make appt with psychiatrist  Tobacco use.  Counseling given regarding importance of cessation. She is not ready to quit now  Return prn

## 2011-12-15 ENCOUNTER — Emergency Department: Payer: Self-pay | Admitting: Unknown Physician Specialty

## 2012-06-07 ENCOUNTER — Ambulatory Visit (HOSPITAL_COMMUNITY): Admitting: Physician Assistant

## 2013-04-18 ENCOUNTER — Other Ambulatory Visit (HOSPITAL_COMMUNITY): Payer: Self-pay | Admitting: Family Medicine

## 2013-04-18 DIAGNOSIS — N200 Calculus of kidney: Secondary | ICD-10-CM

## 2013-04-22 ENCOUNTER — Ambulatory Visit (HOSPITAL_COMMUNITY): Admission: RE | Admit: 2013-04-22 | Source: Ambulatory Visit

## 2013-04-25 ENCOUNTER — Ambulatory Visit (HOSPITAL_COMMUNITY)

## 2013-04-30 ENCOUNTER — Ambulatory Visit (HOSPITAL_COMMUNITY): Attending: Family Medicine

## 2013-05-08 ENCOUNTER — Ambulatory Visit (HOSPITAL_COMMUNITY)
Admission: RE | Admit: 2013-05-08 | Discharge: 2013-05-08 | Disposition: A | Discharge status: Home / Self Care | Source: Ambulatory Visit | Attending: Family Medicine | Admitting: Family Medicine

## 2013-05-08 DIAGNOSIS — N200 Calculus of kidney: Secondary | ICD-10-CM

## 2013-05-08 DIAGNOSIS — K8020 Calculus of gallbladder without cholecystitis without obstruction: Secondary | ICD-10-CM | POA: Insufficient documentation

## 2013-08-06 ENCOUNTER — Emergency Department (HOSPITAL_COMMUNITY)
Admission: EM | Admit: 2013-08-06 | Discharge: 2013-08-06 | Disposition: A | Discharge status: Home / Self Care | Attending: Emergency Medicine | Admitting: Emergency Medicine

## 2013-08-06 ENCOUNTER — Emergency Department (HOSPITAL_COMMUNITY)

## 2013-08-06 ENCOUNTER — Encounter (HOSPITAL_COMMUNITY): Payer: Self-pay | Admitting: Emergency Medicine

## 2013-08-06 DIAGNOSIS — Z79899 Other long term (current) drug therapy: Secondary | ICD-10-CM | POA: Insufficient documentation

## 2013-08-06 DIAGNOSIS — R071 Chest pain on breathing: Secondary | ICD-10-CM | POA: Insufficient documentation

## 2013-08-06 DIAGNOSIS — Z8742 Personal history of other diseases of the female genital tract: Secondary | ICD-10-CM | POA: Insufficient documentation

## 2013-08-06 DIAGNOSIS — IMO0002 Reserved for concepts with insufficient information to code with codable children: Secondary | ICD-10-CM | POA: Insufficient documentation

## 2013-08-06 DIAGNOSIS — F411 Generalized anxiety disorder: Secondary | ICD-10-CM | POA: Insufficient documentation

## 2013-08-06 DIAGNOSIS — F17200 Nicotine dependence, unspecified, uncomplicated: Secondary | ICD-10-CM | POA: Insufficient documentation

## 2013-08-06 DIAGNOSIS — R0789 Other chest pain: Secondary | ICD-10-CM

## 2013-08-06 DIAGNOSIS — F3289 Other specified depressive episodes: Secondary | ICD-10-CM | POA: Insufficient documentation

## 2013-08-06 DIAGNOSIS — F329 Major depressive disorder, single episode, unspecified: Secondary | ICD-10-CM | POA: Insufficient documentation

## 2013-08-06 NOTE — ED Notes (Signed)
Patient reports rib pain that is progressively getting worse. Patient states she has been using ice and Ibuprofen. Patient states she has been coughing and states. "I broke my rib".

## 2013-08-06 NOTE — ED Provider Notes (Signed)
CSN: [id number]     Arrival date & time 08/06/13  0858 History   First MD Initiated Contact with Patient 08/06/13 [phone]     Chief Complaint  Patient presents with  . Rib Injury   (Consider location/radiation/quality/duration/timing/severity/associated sxs/prior Treatment) HPI 43 year old female who presents today stating that she has some left lateral and anterior lower chest wall pain that began after coughing. This has been present for one week. It is sharp in nature. She has not been taking any medication for this. She has not been dyspneic, had fever, or radiation of pain. Past Medical History  Diagnosis Date  . Anxiety   . Depression   . Endometriosis   . Tobacco use    Past Surgical History  Procedure Laterality Date  . Abdominal hysterectomy  10/23/08   Family History  Problem Relation Age of Onset  . Osteoporosis Mother   . Heart disease Father   . Osteoporosis Father    History  Substance Use Topics  . Smoking status: Current Every Day Smoker -- 0.25 packs/day  . Smokeless tobacco: Not on file  . Alcohol Use: Yes     Comment: wine occassionally   OB History   Grav Para Term Preterm Abortions TAB SAB Ect Mult Living   1 1       1 2      Obstetric Comments   Has 3 stepchildren     Review of Systems  All other systems reviewed and are negative.    Allergies  Review of patient's allergies indicates no known allergies.  Home Medications   Current Outpatient Rx  Name  Route  Sig  Dispense  Refill  . buPROPion (WELLBUTRIN SR) 150 MG 12 hr tablet   Oral   Take 150 mg by mouth 2 (two) times daily.         . drospirenone-estradiol (ANGELIQ) 0.5-1 MG per tablet   Oral   Take 1 tablet by mouth 2 (two) times daily.         Marland Kitchen escitalopram (LEXAPRO) 10 MG tablet   Oral   Take 1 tablet (10 mg total) by mouth daily.   30 tablet   1   . fluticasone (FLONASE) 50 MCG/ACT nasal spray      Place one spray in each nostril daily   16 g   6    BP 96/71   Pulse 71  Temp(Src) 98.1 F (36.7 C) (Oral)  SpO2 96% Physical Exam  Nursing note and vitals reviewed. Constitutional: She is oriented to person, place, and time. She appears well-developed and well-nourished.  HENT:  Head: Normocephalic and atraumatic.  Right Ear: External ear normal.  Left Ear: External ear normal.  Nose: Nose normal.  Mouth/Throat: Oropharynx is clear and moist.  Eyes: Conjunctivae and EOM are normal. Pupils are equal, round, and reactive to light.  Neck: Normal range of motion. Neck supple.  Cardiovascular: Normal rate, regular rhythm, normal heart sounds and intact distal pulses.   Pulmonary/Chest: Effort normal and breath sounds normal.  ttp left lateral chest wall  Abdominal: Soft. Bowel sounds are normal.  Musculoskeletal: Normal range of motion.  Neurological: She is alert and oriented to person, place, and time. She has normal reflexes.  Skin: Skin is warm and dry.  Psychiatric: She has a normal mood and affect. Her behavior is normal. Thought content normal.    ED Course  Procedures (including critical care time) Labs Review Labs Reviewed - No data to display Imaging Review Dg Chest 2  View  08/06/2013   CLINICAL DATA:  Right-sided chest wall pain.  EXAM: CHEST  2 VIEW  COMPARISON:  None.  FINDINGS: The heart size and mediastinal contours are within normal limits. Both lungs are clear. The visualized skeletal structures are unremarkable.  IMPRESSION: No active cardiopulmonary disease.   Electronically Signed   By: Margaree Mackintosh M.D.   On: 08/06/2013 10:54  I have reviewed the report and personally reviewed the above radiology studies.    EKG Interpretation   None       MDM  43 y.o. Female with left lower chest wall pain with point tenderness.  No fracture or lung abnormality seen on cxr.  Patient advised.    Shaune Pollack, MD 08/06/13 1101

## 2013-08-06 NOTE — Discharge Instructions (Signed)

## 2013-08-06 NOTE — ED Notes (Signed)
MD at bedside. 

## 2013-08-11 ENCOUNTER — Emergency Department (HOSPITAL_COMMUNITY)
Admission: EM | Admit: 2013-08-11 | Discharge: 2013-08-11 | Disposition: A | Discharge status: Home / Self Care | Attending: Emergency Medicine | Admitting: Emergency Medicine

## 2013-08-11 ENCOUNTER — Encounter (HOSPITAL_COMMUNITY): Payer: Self-pay | Admitting: Emergency Medicine

## 2013-08-11 ENCOUNTER — Emergency Department (HOSPITAL_COMMUNITY)

## 2013-08-11 DIAGNOSIS — F3289 Other specified depressive episodes: Secondary | ICD-10-CM | POA: Insufficient documentation

## 2013-08-11 DIAGNOSIS — R0602 Shortness of breath: Secondary | ICD-10-CM | POA: Insufficient documentation

## 2013-08-11 DIAGNOSIS — F329 Major depressive disorder, single episode, unspecified: Secondary | ICD-10-CM | POA: Insufficient documentation

## 2013-08-11 DIAGNOSIS — R05 Cough: Secondary | ICD-10-CM

## 2013-08-11 DIAGNOSIS — F411 Generalized anxiety disorder: Secondary | ICD-10-CM | POA: Insufficient documentation

## 2013-08-11 DIAGNOSIS — R059 Cough, unspecified: Secondary | ICD-10-CM | POA: Insufficient documentation

## 2013-08-11 DIAGNOSIS — Z87891 Personal history of nicotine dependence: Secondary | ICD-10-CM | POA: Insufficient documentation

## 2013-08-11 DIAGNOSIS — R071 Chest pain on breathing: Secondary | ICD-10-CM | POA: Insufficient documentation

## 2013-08-11 DIAGNOSIS — R0789 Other chest pain: Secondary | ICD-10-CM | POA: Diagnosis present

## 2013-08-11 DIAGNOSIS — Z79899 Other long term (current) drug therapy: Secondary | ICD-10-CM | POA: Insufficient documentation

## 2013-08-11 DIAGNOSIS — IMO0002 Reserved for concepts with insufficient information to code with codable children: Secondary | ICD-10-CM | POA: Insufficient documentation

## 2013-08-11 DIAGNOSIS — Z8742 Personal history of other diseases of the female genital tract: Secondary | ICD-10-CM | POA: Insufficient documentation

## 2013-08-11 MED ORDER — OXYCODONE-ACETAMINOPHEN 5-325 MG PO TABS
1.0000 | ORAL_TABLET | Freq: Once | ORAL | Status: AC
  Administered 2013-08-11: 1 via ORAL
  Filled 2013-08-11: qty 1

## 2013-08-11 MED ORDER — BENZONATATE 100 MG PO CAPS: 100.0000 mg | ORAL_CAPSULE | Freq: Two times a day (BID) | ORAL | Status: DC

## 2013-08-11 MED ORDER — OXYMETAZOLINE HCL 0.05 % NA SOLN
1.0000 | Freq: Two times a day (BID) | NASAL | Status: DC
  Administered 2013-08-11: 1 via NASAL
  Filled 2013-08-11: qty 15

## 2013-08-11 MED ORDER — OXYCODONE-ACETAMINOPHEN 5-325 MG PO TABS: 1.0000 | ORAL_TABLET | Freq: Four times a day (QID) | ORAL | Status: DC | PRN

## 2013-08-11 MED ORDER — CETIRIZINE HCL 5 MG/5ML PO SYRP: 5.0000 mg | ORAL_SOLUTION | Freq: Once | ORAL | Status: DC

## 2013-08-11 MED ORDER — LORATADINE 10 MG PO TABS
10.0000 mg | ORAL_TABLET | Freq: Once | ORAL | Status: AC
Start: 2013-08-11 — End: 2013-08-11
  Administered 2013-08-11: 10 mg via ORAL
  Filled 2013-08-11: qty 1

## 2013-08-11 NOTE — ED Notes (Signed)
Pt states hurts to breath and states pain started 2 weeks ago under leftt breast.  Pt was diagnosed with bruised lung and reports that the pain is worse  Pt states hurts to laugh or cough.

## 2013-08-11 NOTE — ED Notes (Signed)
Med given to the pt for nasal stuffiness

## 2013-08-11 NOTE — ED Provider Notes (Signed)
CSN: [id number]     Arrival date & time 08/11/13  1544 History   First MD Initiated Contact with Patient 08/11/13 1703     Chief Complaint  Patient presents with  . Chest Pain    left rib pain   (Consider location/radiation/quality/duration/timing/severity/associated sxs/prior Treatment) Patient is a 43 y.o. female presenting with chest pain. The history is provided by the patient.  Chest Pain Pain location:  L chest Pain quality: aching   Pain radiates to:  Does not radiate Pain radiates to the back: no   Pain severity:  Moderate Onset quality:  Sudden Duration:  2 weeks Timing:  Constant Progression:  Unchanged Chronicity:  New Context comment:  While coughing Relieved by: nsaids. Worsened by:  Coughing and deep breathing Ineffective treatments:  None tried Associated symptoms: cough and shortness of breath   Associated symptoms: no abdominal pain, no back pain, no dizziness, no fatigue, no fever, no headache, no nausea and not vomiting     Past Medical History  Diagnosis Date  . Anxiety   . Depression   . Endometriosis   . Tobacco use    Past Surgical History  Procedure Laterality Date  . Abdominal hysterectomy  10/23/08   Family History  Problem Relation Age of Onset  . Osteoporosis Mother   . Heart disease Father   . Osteoporosis Father    History  Substance Use Topics  . Smoking status: Former Smoker -- 0.25 packs/day  . Smokeless tobacco: Not on file  . Alcohol Use: Yes     Comment: wine occassionally   OB History   Grav Para Term Preterm Abortions TAB SAB Ect Mult Living   1 1       1 2      Obstetric Comments   Has 3 stepchildren     Review of Systems  Constitutional: Negative for fever and fatigue.  HENT: Negative for congestion and drooling.   Eyes: Negative for pain.  Respiratory: Positive for cough and shortness of breath.   Cardiovascular: Positive for chest pain.  Gastrointestinal: Negative for nausea, vomiting, abdominal pain and  diarrhea.  Genitourinary: Negative for dysuria and hematuria.  Musculoskeletal: Negative for back pain, gait problem and neck pain.  Skin: Negative for color change.  Neurological: Negative for dizziness and headaches.  Hematological: Negative for adenopathy.  Psychiatric/Behavioral: Negative for behavioral problems.  All other systems reviewed and are negative.    Allergies  Review of patient's allergies indicates no known allergies.  Home Medications   Current Outpatient Rx  Name  Route  Sig  Dispense  Refill  . buPROPion (WELLBUTRIN SR) 150 MG 12 hr tablet   Oral   Take 150 mg by mouth 2 (two) times daily.         . drospirenone-estradiol (ANGELIQ) 0.5-1 MG per tablet   Oral   Take 1 tablet by mouth 2 (two) times daily.         Marland Kitchen escitalopram (LEXAPRO) 10 MG tablet   Oral   Take 1 tablet (10 mg total) by mouth daily.   30 tablet   1   . fluticasone (FLONASE) 50 MCG/ACT nasal spray      Place one spray in each nostril daily   16 g   6    BP 108/71  Pulse 67  Temp(Src) 97.6 F (36.4 C) (Oral)  Resp 16  SpO2 99% Physical Exam  Nursing note and vitals reviewed. Constitutional: She is oriented to person, place, and time. She appears well-developed  and well-nourished.  HENT:  Head: Normocephalic and atraumatic.  Mouth/Throat: Oropharynx is clear and moist. No oropharyngeal exudate.  Eyes: Conjunctivae and EOM are normal. Pupils are equal, round, and reactive to light.  Neck: Normal range of motion. Neck supple.  Cardiovascular: Normal rate, regular rhythm, normal heart sounds and intact distal pulses.  Exam reveals no gallop and no friction rub.   No murmur heard. Pulmonary/Chest: Effort normal and breath sounds normal. No respiratory distress. She has no wheezes. She exhibits tenderness ( mild to moderate tenderness to palpation of the left lower anterior ribs and sternum).  Abdominal: Soft. Bowel sounds are normal. There is no tenderness. There is no rebound  and no guarding.  Musculoskeletal: Normal range of motion. She exhibits no edema and no tenderness.  Neurological: She is alert and oriented to person, place, and time.  Skin: Skin is warm and dry.  Psychiatric: She has a normal mood and affect. Her behavior is normal.    ED Course  Procedures (including critical care time) Labs Review Labs Reviewed - No data to display Imaging Review No results found.  EKG Interpretation    Date/Time:  Sunday August 11 2013 16:02:02 EST Ventricular Rate:  64 PR Interval:  150 QRS Duration: 78 QT Interval:  398 QTC Calculation: 410 R Axis:   67 Text Interpretation:  Normal sinus rhythm Normal ECG Confirmed by Ilyse Tremain  MD, Massie Cogliano ([id number]) on 08/11/2013 6:02:20 PM            MDM   1. Chest wall pain   2. Cough    6:02 PM 43 y.o. female who presents with ongoing left anterior chest wall pain which began approximately 1.5 weeks ago. The patient has been seen previously in the ER and had a noncontributory chest x-ray. She states that her symptoms began during a bout of coughing. She notes that she's continued to have coughing and I suspect this is the reason why she continues to have pain. She does note that ibuprofen does seem to help significantly. She has pain with deep breathing. Cannot PERC d/t exogenous estrogen use. Well's neg w/ more likely alternative dx given pain is reproducible with palpation on exam. VS unremarkable here. Pt well appearing. Will repeat chest x-ray and give a Percocet for pain.  8:47 PM: CXR non-contrib. Pt continues to appear well. Will provide Rx for home.  I have discussed the diagnosis/risks/treatment options with the patient and believe the pt to be eligible for discharge home to follow-up with pcp as needed. We also discussed returning to the ED immediately if new or worsening sx occur. We discussed the sx which are most concerning (e.g., worsening pain, sob, fever) that necessitate immediate return. Medications  administered to the patient during their visit and any new prescriptions provided to the patient are listed below.  Medications given during this visit Medications  oxymetazoline (AFRIN) 0.05 % nasal spray 1 spray (1 spray Each Nare Given 08/11/13 1853)  oxyCODONE-acetaminophen (PERCOCET/ROXICET) 5-325 MG per tablet 1 tablet (1 tablet Oral Given 08/11/13 1827)  loratadine (CLARITIN) tablet 10 mg (10 mg Oral Given 08/11/13 2023)    Discharge Medication List as of 08/11/2013  8:48 PM    START taking these medications   Details  benzonatate (TESSALON) 100 MG capsule Take 1 capsule (100 mg total) by mouth 2 (two) times daily., Starting 08/11/2013, Until Discontinued, Print    oxyCODONE-acetaminophen (PERCOCET) 5-325 MG per tablet Take 1 tablet by mouth every 6 (six) hours as needed for moderate  pain., Starting 08/11/2013, Until Discontinued, Print         Blanchard Kelch, MD 08/12/13 1324

## 2013-08-11 NOTE — ED Notes (Signed)
The pt has gone to xray and returned.  Alert no distress

## 2013-08-11 NOTE — ED Notes (Signed)
The pt is alert comfortable at present.  Much better after the percocet.  Waiting for xray

## 2013-08-11 NOTE — ED Notes (Signed)
claritin has not arrived.  Pharmacy  Notified.  Still there on the way now

## 2013-08-11 NOTE — Discharge Instructions (Signed)

## 2014-01-22 ENCOUNTER — Emergency Department (HOSPITAL_COMMUNITY)
Admission: EM | Admit: 2014-01-22 | Discharge: 2014-01-22 | Disposition: A | Discharge status: Home / Self Care | Attending: Emergency Medicine | Admitting: Emergency Medicine

## 2014-01-22 ENCOUNTER — Emergency Department (HOSPITAL_COMMUNITY)

## 2014-01-22 ENCOUNTER — Encounter (HOSPITAL_COMMUNITY): Payer: Self-pay | Admitting: Emergency Medicine

## 2014-01-22 DIAGNOSIS — F411 Generalized anxiety disorder: Secondary | ICD-10-CM | POA: Insufficient documentation

## 2014-01-22 DIAGNOSIS — F329 Major depressive disorder, single episode, unspecified: Secondary | ICD-10-CM | POA: Insufficient documentation

## 2014-01-22 DIAGNOSIS — Z9889 Other specified postprocedural states: Secondary | ICD-10-CM | POA: Insufficient documentation

## 2014-01-22 DIAGNOSIS — K8020 Calculus of gallbladder without cholecystitis without obstruction: Secondary | ICD-10-CM | POA: Insufficient documentation

## 2014-01-22 DIAGNOSIS — Z90710 Acquired absence of both cervix and uterus: Secondary | ICD-10-CM | POA: Insufficient documentation

## 2014-01-22 DIAGNOSIS — M542 Cervicalgia: Secondary | ICD-10-CM | POA: Insufficient documentation

## 2014-01-22 DIAGNOSIS — R0602 Shortness of breath: Secondary | ICD-10-CM | POA: Insufficient documentation

## 2014-01-22 DIAGNOSIS — K8050 Calculus of bile duct without cholangitis or cholecystitis without obstruction: Secondary | ICD-10-CM

## 2014-01-22 DIAGNOSIS — Z87448 Personal history of other diseases of urinary system: Secondary | ICD-10-CM | POA: Insufficient documentation

## 2014-01-22 DIAGNOSIS — R51 Headache: Secondary | ICD-10-CM | POA: Insufficient documentation

## 2014-01-22 DIAGNOSIS — F17200 Nicotine dependence, unspecified, uncomplicated: Secondary | ICD-10-CM | POA: Insufficient documentation

## 2014-01-22 DIAGNOSIS — R6883 Chills (without fever): Secondary | ICD-10-CM | POA: Insufficient documentation

## 2014-01-22 DIAGNOSIS — Z79899 Other long term (current) drug therapy: Secondary | ICD-10-CM | POA: Insufficient documentation

## 2014-01-22 DIAGNOSIS — F3289 Other specified depressive episodes: Secondary | ICD-10-CM | POA: Insufficient documentation

## 2014-01-22 DIAGNOSIS — K920 Hematemesis: Secondary | ICD-10-CM | POA: Insufficient documentation

## 2014-01-22 DIAGNOSIS — R079 Chest pain, unspecified: Secondary | ICD-10-CM | POA: Insufficient documentation

## 2014-01-22 HISTORY — DX: Noninfective gastroenteritis and colitis, unspecified: K52.9

## 2014-01-22 LAB — COMPREHENSIVE METABOLIC PANEL
ALBUMIN: 4.1 g/dL (ref 3.5–5.2)
ALT: 19 U/L (ref 0–35)
AST: 20 U/L (ref 0–37)
Alkaline Phosphatase: 76 U/L (ref 39–117)
Anion gap: 11 (ref 5–15)
BUN: 19 mg/dL (ref 6–23)
CO2: 28 mEq/L (ref 19–32)
Calcium: 9.1 mg/dL (ref 8.4–10.5)
Chloride: 102 mEq/L (ref 96–112)
Creatinine, Ser: 0.8 mg/dL (ref 0.50–1.10)
GFR calc non Af Amer: 90 mL/min — ABNORMAL LOW (ref >90)
GLUCOSE: 109 mg/dL — AB (ref 70–99)
Potassium: 4.3 mEq/L (ref 3.7–5.3)
SODIUM: 141 meq/L (ref 137–147)
TOTAL PROTEIN: 6.8 g/dL (ref 6.0–8.3)
Total Bilirubin: 0.1 mg/dL — ABNORMAL LOW (ref 0.3–1.2)

## 2014-01-22 LAB — CBC WITH DIFFERENTIAL/PLATELET
Basophils Absolute: 0 10*3/uL (ref 0.0–0.1)
Basophils Relative: 0 % (ref 0–1)
Eosinophils Absolute: 0.1 10*3/uL (ref 0.0–0.7)
Eosinophils Relative: 1 % (ref 0–5)
HCT: 38.1 % (ref 36.0–46.0)
Hemoglobin: 13.3 g/dL (ref 12.0–15.0)
Lymphocytes Relative: 17 % (ref 12–46)
Lymphs Abs: 1.4 10*3/uL (ref 0.7–4.0)
MCH: 29.9 pg (ref 26.0–34.0)
MCHC: 34.9 g/dL (ref 30.0–36.0)
MCV: 85.6 fL (ref 78.0–100.0)
Monocytes Absolute: 0.3 10*3/uL (ref 0.1–1.0)
Monocytes Relative: 4 % (ref 3–12)
Neutro Abs: 6 10*3/uL (ref 1.7–7.7)
Neutrophils Relative %: 78 % — ABNORMAL HIGH (ref 43–77)
PLATELETS: 237 10*3/uL (ref 150–400)
RBC: 4.45 MIL/uL (ref 3.87–5.11)
RDW: 11.9 % (ref 11.5–15.5)
WBC: 7.8 10*3/uL (ref 4.0–10.5)

## 2014-01-22 LAB — LIPASE, BLOOD: Lipase: 60 U/L — ABNORMAL HIGH (ref 11–59)

## 2014-01-22 MED ORDER — PROMETHAZINE HCL 25 MG PO TABS: 25.0000 mg | ORAL_TABLET | Freq: Four times a day (QID) | ORAL | Status: DC | PRN

## 2014-01-22 MED ORDER — ONDANSETRON HCL 4 MG/2ML IJ SOLN
4.0000 mg | Freq: Once | INTRAMUSCULAR | Status: AC
  Administered 2014-01-22: 4 mg via INTRAVENOUS
  Filled 2014-01-22: qty 2

## 2014-01-22 MED ORDER — ONDANSETRON HCL 4 MG/2ML IJ SOLN
INTRAMUSCULAR | Status: AC
  Administered 2014-01-22: 4 mg via INTRAVENOUS
  Filled 2014-01-22: qty 2

## 2014-01-22 MED ORDER — OXYCODONE-ACETAMINOPHEN 5-325 MG PO TABS: 1.0000 | ORAL_TABLET | Freq: Four times a day (QID) | ORAL | Status: DC | PRN

## 2014-01-22 MED ORDER — IOHEXOL 300 MG/ML  SOLN
100.0000 mL | Freq: Once | INTRAMUSCULAR | Status: AC | PRN
  Administered 2014-01-22: 100 mL via INTRAVENOUS

## 2014-01-22 MED ORDER — SODIUM CHLORIDE 0.9 % IV BOLUS (SEPSIS)
1000.0000 mL | Freq: Once | INTRAVENOUS | Status: AC
Start: 2014-01-22 — End: 2014-01-22
  Administered 2014-01-22: 1000 mL via INTRAVENOUS

## 2014-01-22 MED ORDER — HYDROMORPHONE HCL PF 1 MG/ML IJ SOLN
1.0000 mg | Freq: Once | INTRAMUSCULAR | Status: AC
  Administered 2014-01-22: 1 mg via INTRAVENOUS
  Filled 2014-01-22: qty 1

## 2014-01-22 MED ORDER — SODIUM CHLORIDE 0.9 % IV SOLN: INTRAVENOUS | Status: DC

## 2014-01-22 MED ORDER — ONDANSETRON HCL 4 MG/2ML IJ SOLN
4.0000 mg | Freq: Once | INTRAMUSCULAR | Status: AC
  Administered 2014-01-22: 4 mg via INTRAVENOUS

## 2014-01-22 NOTE — ED Notes (Signed)
Patient requesting pain and nausea medication upon return to room. MD made aware and zofran/pain medication order obtained. RN in to room to medicate patient and trying to inform patient about what she is going to receive and patient interrupts nurse stating that she is going to leave if she does not receive medication she is leaving. RN redirected patient and finished stating that she was to receive pain medication.

## 2014-01-22 NOTE — ED Notes (Addendum)
Vomiting and right flank pain began this morning. Pt states she noticed "a lot" of blood in the vomit. PT states, "I need something, or scheduled surgery to take something out" Pt was at snack machine when called for triage. Husband gave pt Percocet 10mg  PTA along with a nerve pill. PT states the pillsl helped  a lot. States she has vomited numerous times.

## 2014-01-22 NOTE — ED Provider Notes (Signed)
CSN: [id number]     Arrival date & time 01/22/14  1144 History  This chart was scribed for Fredia Sorrow, MD by Peyton Bottoms, ED Scribe. This patient was seen in room APA01/APA01 and the patient's care was started at 12:40 PM.  Chief Complaint  Patient presents with  . Emesis   Patient is a 43 y.o. female presenting with abdominal pain. The history is provided by the patient. No language interpreter was used.  Abdominal Pain Pain location:  RUQ Pain radiates to:  Back Pain severity:  Severe Onset quality:  Sudden Duration:  18 hours Timing:  Rare Progression:  Unable to specify Chronicity:  Recurrent Context: alcohol use   Relieved by: 10mg  Percocet. Worsened by:  Nothing tried Ineffective treatments: Klonopin. Associated symptoms: chest pain, chills, diarrhea, hematemesis, nausea, shortness of breath and vomiting   Associated symptoms: no cough, no dysuria and no fever    HPI Comments: Samantha Lyons is a 43 y.o. Female with history of gallstones and Colitis who presents to the Emergency Department complaining of RUQ abdominal pain radiating to the back onset this morning at 5:00 AM. She reports multiple associated episodes of emesis this morning, and describes that later episodes have had streaks of blood, and even later episodes have had "pools of blood". Patient describes abdominal pain as 10/10 at worst and 6/10 currently. Patient tried husband's Klonopin without relief. Patient also states she took her husband's 10mg  Percocet at 5am this morning which alleviated some pain. Patient states she has been having 4-5 loose bowel movements/day for the past 10 days.  Patient's PCP is Dr. Robina Ade.  Past Medical History  Diagnosis Date  . Anxiety   . Depression   . Endometriosis   . Tobacco use   . Colitis    Past Surgical History  Procedure Laterality Date  . Abdominal hysterectomy  10/23/08  . Cesarean section    . Abdominoplasty     Family History  Problem Relation Age of  Onset  . Osteoporosis Mother   . Heart disease Father   . Osteoporosis Father    History  Substance Use Topics  . Smoking status: Current Every Day Smoker -- 0.25 packs/day    Types: Cigarettes  . Smokeless tobacco: Not on file  . Alcohol Use: Yes     Comment: beer occ   OB History   Grav Para Term Preterm Abortions TAB SAB Ect Mult Living   1 1       1 2      Obstetric Comments   Has 3 stepchildren     Review of Systems  Constitutional: Positive for chills. Negative for fever.  HENT: Negative for congestion, rhinorrhea and sinus pressure.   Eyes: Negative for visual disturbance.  Respiratory: Positive for shortness of breath. Negative for cough.   Cardiovascular: Positive for chest pain. Negative for leg swelling.  Gastrointestinal: Positive for nausea, vomiting, abdominal pain, diarrhea and hematemesis.  Genitourinary: Negative for dysuria.  Musculoskeletal: Positive for back pain.  Skin: Negative for rash.  Neurological: Positive for headaches.  Hematological: Does not bruise/bleed easily.  Psychiatric/Behavioral: Negative for confusion.    Allergies  Review of patient's allergies indicates no known allergies.  Home Medications   Prior to Admission medications   Medication Sig Start Date End Date Taking? Authorizing Provider  buPROPion (WELLBUTRIN XL) 300 MG 24 hr tablet Take 300 mg by mouth daily.   Yes Historical Provider, MD  drospirenone-estradiol (ANGELIQ) 0.5-1 MG per tablet Take 1 tablet by mouth  2 (two) times daily.   Yes Historical Provider, MD  escitalopram (LEXAPRO) 10 MG tablet Take 30 mg by mouth at bedtime. 10/04/11  Yes Lanice Shirts, MD  L-Glutamine 500 MG CAPS Take 1 capsule by mouth daily. Hold while in hospital   Yes Historical Provider, MD  Licorice Deglycyrrhizinated POWD Take 1 tablet by mouth daily.   Yes Historical Provider, MD  Magnesium 400 MG CAPS Take 1 tablet by mouth daily. Hold while in hospital   Yes Historical Provider, MD   oxyCODONE-acetaminophen (PERCOCET/ROXICET) 5-325 MG per tablet Take 1-2 tablets by mouth every 6 (six) hours as needed. 01/22/14   Fredia Sorrow, MD  promethazine (PHENERGAN) 25 MG tablet Take 1 tablet (25 mg total) by mouth every 6 (six) hours as needed. 01/22/14   Fredia Sorrow, MD   Triage Vitals: BP 105/63  Temp(Src) 97.9 F (36.6 C) (Oral)  Resp 18  Ht 5\' 5"  (1.651 m)  Wt 170 lb (77.111 kg)  BMI 28.29 kg/m2  SpO2 99% Physical Exam  Nursing note and vitals reviewed. Constitutional: She is oriented to person, place, and time. She appears well-developed and well-nourished. No distress.  HENT:  Head: Normocephalic and atraumatic.  Eyes: Conjunctivae and EOM are normal.  Neck: Neck supple. No tracheal deviation present.  Cardiovascular: Normal rate and regular rhythm.   Pulmonary/Chest: Effort normal and breath sounds normal. No respiratory distress. She has no wheezes. She has no rales.  Abdominal: Bowel sounds are normal. There is no rebound and no guarding.  RUQ pain  Musculoskeletal: Normal range of motion. She exhibits no edema.  Neurological: She is alert and oriented to person, place, and time.  Skin: Skin is warm and dry.  Psychiatric: She has a normal mood and affect. Her behavior is normal.    ED Course  Procedures (including critical care time)  DIAGNOSTIC STUDIES: Oxygen Saturation is 99% on RA, Normal by my interpretation.    COORDINATION OF CARE:  12:45 PM- Pt advised of plan for treatment and pt agrees.  Medications  0.9 %  sodium chloride infusion (not administered)  sodium chloride 0.9 % bolus 1,000 mL (1,000 mLs Intravenous New Bag/Given 01/22/14 1308)  ondansetron (ZOFRAN) injection 4 mg (4 mg Intravenous Given 01/22/14 1307)  HYDROmorphone (DILAUDID) injection 1 mg (1 mg Intravenous Given 01/22/14 1501)  iohexol (OMNIPAQUE) 300 MG/ML solution 100 mL (100 mLs Intravenous Contrast Given 01/22/14 1428)  ondansetron (ZOFRAN) injection 4 mg (4 mg Intravenous  Given 01/22/14 1500)   Results for orders placed during the hospital encounter of 01/22/14  COMPREHENSIVE METABOLIC PANEL      Result Value Ref Range   Sodium 141  137 - 147 mEq/L   Potassium 4.3  3.7 - 5.3 mEq/L   Chloride 102  96 - 112 mEq/L   CO2 28  19 - 32 mEq/L   Glucose, Bld 109 (*) 70 - 99 mg/dL   BUN 19  6 - 23 mg/dL   Creatinine, Ser 0.80  0.50 - 1.10 mg/dL   Calcium 9.1  8.4 - 10.5 mg/dL   Total Protein 6.8  6.0 - 8.3 g/dL   Albumin 4.1  3.5 - 5.2 g/dL   AST 20  0 - 37 U/L   ALT 19  0 - 35 U/L   Alkaline Phosphatase 76  39 - 117 U/L   Total Bilirubin 0.1 (*) 0.3 - 1.2 mg/dL   GFR calc non Af Amer 90 (*) >90 mL/min   GFR calc Af Amer >90  >  90 mL/min   Anion gap 11  5 - 15  LIPASE, BLOOD      Result Value Ref Range   Lipase 60 (*) 11 - 59 U/L  CBC WITH DIFFERENTIAL      Result Value Ref Range   WBC 7.8  4.0 - 10.5 K/uL   RBC 4.45  3.87 - 5.11 MIL/uL   Hemoglobin 13.3  12.0 - 15.0 g/dL   HCT 38.1  36.0 - 46.0 %   MCV 85.6  78.0 - 100.0 fL   MCH 29.9  26.0 - 34.0 pg   MCHC 34.9  30.0 - 36.0 g/dL   RDW 11.9  11.5 - 15.5 %   Platelets 237  150 - 400 K/uL   Neutrophils Relative % 78 (*) 43 - 77 %   Neutro Abs 6.0  1.7 - 7.7 K/uL   Lymphocytes Relative 17  12 - 46 %   Lymphs Abs 1.4  0.7 - 4.0 K/uL   Monocytes Relative 4  3 - 12 %   Monocytes Absolute 0.3  0.1 - 1.0 K/uL   Eosinophils Relative 1  0 - 5 %   Eosinophils Absolute 0.1  0.0 - 0.7 K/uL   Basophils Relative 0  0 - 1 %   Basophils Absolute 0.0  0.0 - 0.1 K/uL   Imaging Review Dg Chest 2 View  01/22/2014   CLINICAL DATA:  43 year old female with vomiting and pain.  EXAM: CHEST  2 VIEW  COMPARISON:  08/11/2013  FINDINGS: The cardiomediastinal silhouette is unremarkable.  There is no evidence of focal airspace disease, pulmonary edema, suspicious pulmonary nodule/mass, pleural effusion, or pneumothorax. No acute bony abnormalities are identified.  Bilateral healing rib fractures are noted.  IMPRESSION: No  active cardiopulmonary disease.   Electronically Signed   By: Hassan Rowan M.D.   On: 01/22/2014 14:33   Ct Abdomen Pelvis W Contrast  01/22/2014   CLINICAL DATA:  Upper abdominal pain and nausea  EXAM: CT ABDOMEN AND PELVIS WITH CONTRAST  TECHNIQUE: Multidetector CT imaging of the abdomen and pelvis was performed using the standard protocol following bolus administration of intravenous contrast.  CONTRAST:  140mL OMNIPAQUE IOHEXOL 300 MG/ML  SOLN  COMPARISON:  05/08/2013  FINDINGS: Clear lung bases.  Heart is normal in size.  Normal liver and spleen.  Stable gallstones. No evidence of acute cholecystitis. No bile duct dilation.  Normal pancreas. 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 Normal right adrenal gland.  Sub cm focus of low density in the anterior right kidney below the midpole, likely a small cyst. Kidneys otherwise unremarkable. Normal ureters. Normal bladder.  Uterus is surgically absent.  No pelvic masses.  No adenopathy.  No abnormal fluid collections.  Normal colon and small bowel.  Normal appendix.  Mild degenerative changes noted throughout the visualized spine.  IMPRESSION: 1. No acute findings. No findings to explain abdominal pain or nausea. 2. Cholelithiasis without evidence of acute cholecystitis. 3. Status post hysterectomy. Sub cm low-density right renal lesion, likely a cyst. Mild degenerative changes noted throughout the visualized spine. 4. No other abnormalities.  No change the prior exam.   Electronically Signed   By: Lajean Manes M.D.   On: 01/22/2014 14:44     EKG Interpretation None      MDM   Final diagnoses:  Biliary colic    For patient's symptoms consistent with biliary colic. Patient's pain was improving upon arrival here. Had only minimal tenderness in the right upper  quadrant. Currently now has no tenderness right upper quadrant. Not given any pain medication here. CT scan shows cholelithiasis without evidence of acute cholecystitis. Labs also show no  leukocytosis no leg elevation in the bilirubin. Lipase is mildly elevated the pancreas was normal on CT scan. No significant liver function test abnormalities. Patient can be referred to general surgery for elective removal of the gallbladder. Referral information provided for Dr. Arnoldo Morale. Patient will return for recurrent abdominal pain not resolving in 1-2 hours. Patient will avoid fatty foods. Patient's chest x-ray no acute findings. No evidence of pneumonia or pneumothorax or pulmonary edema.       I personally performed the services described in this documentation, which was scribed in my presence. The recorded information has been reviewed and is accurate.     Fredia Sorrow, MD 01/22/14 1531

## 2014-01-22 NOTE — ED Notes (Signed)
Patient with no complaints at this time. Respirations even and unlabored. Skin warm/dry. Discharge instructions reviewed with patient at this time. Patient given opportunity to voice concerns/ask questions. IV removed per policy and band-aid applied to site. Patient discharged at this time and left Emergency Department with steady gait.  

## 2014-01-22 NOTE — ED Notes (Signed)
Patient eating salt and vinegar chips in room. Advised by Staff to not eat.

## 2014-01-22 NOTE — Discharge Instructions (Signed)
Biliary Colic  °Biliary colic is a steady or irregular pain in the upper abdomen. It is usually under the right side of the rib cage. It happens when gallstones interfere with the normal flow of bile from the gallbladder. Bile is a liquid that helps to digest fats. Bile is made in the liver and stored in the gallbladder. When you eat a meal, bile passes from the gallbladder through the cystic duct and the common bile duct into the small intestine. There, it mixes with partially digested food. If a gallstone blocks either of these ducts, the normal flow of bile is blocked. The muscle cells in the bile duct contract forcefully to try to move the stone. This causes the pain of biliary colic.  °SYMPTOMS  °· A person with biliary colic usually complains of pain in the upper abdomen. This pain can be: °¨ In the center of the upper abdomen just below the breastbone. °¨ In the upper-right part of the abdomen, near the gallbladder and liver. °¨ Spread back toward the right shoulder blade. °· Nausea and vomiting. °· The pain usually occurs after eating. °· Biliary colic is usually triggered by the digestive system's demand for bile. The demand for bile is high after fatty meals. Symptoms can also occur when a person who has been fasting suddenly eats a very large meal. Most episodes of biliary colic pass after 1 to 5 hours. After the most intense pain passes, your abdomen may continue to ache mildly for about 24 hours. °DIAGNOSIS  °After you describe your symptoms, your caregiver will perform a physical exam. He or she will pay attention to the upper right portion of your belly (abdomen). This is the area of your liver and gallbladder. An ultrasound will help your caregiver look for gallstones. Specialized scans of the gallbladder may also be done. Blood tests may be done, especially if you have fever or if your pain persists. °PREVENTION  °Biliary colic can be prevented by controlling the risk factors for gallstones. Some of  these risk factors, such as heredity, increasing age, and pregnancy are a normal part of life. Obesity and a high-fat diet are risk factors you can change through a healthy lifestyle. Women going through menopause who take hormone replacement therapy (estrogen) are also more likely to develop biliary colic. °TREATMENT  °· Pain medication may be prescribed. °· You may be encouraged to eat a fat-free diet. °· If the first episode of biliary colic is severe, or episodes of colic keep retuning, surgery to remove the gallbladder (cholecystectomy) is usually recommended. This procedure can be done through small incisions using an instrument called a laparoscope. The procedure often requires a brief stay in the hospital. Some people can leave the hospital the same day. It is the most widely used treatment in people troubled by painful gallstones. It is effective and safe, with no complications in more than 90% of cases. °· If surgery cannot be done, medication that dissolves gallstones may be used. This medication is expensive and can take months or years to work. Only small stones will dissolve. °· Rarely, medication to dissolve gallstones is combined with a procedure called shock-wave lithotripsy. This procedure uses carefully aimed shock waves to break up gallstones. In many people treated with this procedure, gallstones form again within a few years. °PROGNOSIS  °If gallstones block your cystic duct or common bile duct, you are at risk for repeated episodes of biliary colic. There is also a 25% chance that you will develop   a gallbladder infection(acute cholecystitis), or some other complication of gallstones within 10 to 20 years. If you have surgery, schedule it at a time that is convenient for you and at a time when you are not sick. HOME CARE INSTRUCTIONS   Drink plenty of clear fluids.  Avoid fatty, greasy or fried foods, or any foods that make your pain worse.  Take medications as directed. SEEK MEDICAL  CARE IF:   You develop a fever over 100.5 F (38.1 C).  Your pain gets worse over time.  You develop nausea that prevents you from eating and drinking.  You develop vomiting. SEEK IMMEDIATE MEDICAL CARE IF:   You have continuous or severe belly (abdominal) pain which is not relieved with medications.  You develop nausea and vomiting which is not relieved with medications.  You have symptoms of biliary colic and you suddenly develop a fever and shaking chills. This may signal cholecystitis. Call your caregiver immediately.  You develop a yellow color to your skin or the white part of your eyes (jaundice). Document Released: 11/21/2005 Document Revised: 09/12/2011 Document Reviewed: 01/31/2008 [facility] Patient Information 2015 [place], [place]. This information is not intended to replace advice given to you by your health care provider. Make sure you discuss any questions you have with your health care provider.  Cholecystitis Cholecystitis is an inflammation of your gallbladder. It is usually caused by a buildup of gallstones or sludge (cholelithiasis) in your gallbladder. The gallbladder stores a fluid that helps digest fats (bile). Cholecystitis is serious and needs treatment right away.  CAUSES   Gallstones. Gallstones can block the tube that leads to your gallbladder, causing bile to build up. As bile builds up, the gallbladder becomes inflamed.  Bile duct problems, such as blockage from scarring or kinking.  Tumors. Tumors can stop bile from leaving your gallbladder correctly, causing bile to build up. As bile builds up, the gallbladder becomes inflamed. SYMPTOMS   Nausea.  Vomiting.  Abdominal pain, especially in the upper right area of your abdomen.  Abdominal tenderness or bloating.  Sweating.  Chills.  Fever.  Yellowing of the skin and the whites of the eyes (jaundice). DIAGNOSIS  Your caregiver may order blood tests to look for infection or gallbladder  problems. Your caregiver may also order imaging tests, such as an ultrasound or computed tomography (CT) scan. Further tests may include a hepatobiliary iminodiacetic acid (HIDA) scan. This scan allows your caregiver to see your bile move from the liver to the gallbladder and to the small intestine. TREATMENT  A hospital stay is usually necessary to lessen the inflammation of your gallbladder. You may be required to not eat or drink (fast) for a certain amount of time. You may be given medicine to treat pain or an antibiotic medicine to treat an infection. Surgery may be needed to remove your gallbladder (cholecystectomy) once the inflammation has gone down. Surgery may be needed right away if you develop complications such as death of gallbladder tissue (gangrene) or a tear (perforation) of the gallbladder.  [place] care will depend on your treatment. In general:  If you were given antibiotics, take them as directed. Finish them even if you start to feel better.  Only take over-the-counter or prescription medicines for pain, discomfort, or fever as directed by your caregiver.  Follow a low-fat diet until you see your caregiver again.  Keep all follow-up visits as directed by your caregiver. SEEK IMMEDIATE MEDICAL CARE IF:   Your pain is  increasing and not controlled by medicines.  Your pain moves to another part of your abdomen or to your back.  You have a fever.  You have nausea and vomiting. MAKE SURE YOU:  Understand these instructions.  Will watch your condition.  Will get help right away if you are not doing well or get worse. Document Released: 06/20/2005 Document Revised: 09/12/2011 Document Reviewed: 05/06/2011 [facility] Patient Information 2015 [place], [place]. This information is not intended to replace advice given to you by your health care provider. Make sure you discuss any questions you have with your health care provider.  Avoid fatty foods. Make  an appointment to followup with Dr. Arnoldo Morale to have your gallbladder removed. Return for development of abdominal pain that does not resolve in one to 2 hours. Take pain medication and antinausea medicine as needed.

## 2014-01-24 ENCOUNTER — Encounter (HOSPITAL_COMMUNITY): Payer: Self-pay | Admitting: Emergency Medicine

## 2014-01-24 ENCOUNTER — Emergency Department (HOSPITAL_COMMUNITY)

## 2014-01-24 ENCOUNTER — Observation Stay (HOSPITAL_COMMUNITY)
Admission: EM | Admit: 2014-01-24 | Discharge: 2014-01-27 | Disposition: A | Discharge status: Home / Self Care | Attending: Family Medicine | Admitting: Family Medicine

## 2014-01-24 DIAGNOSIS — K8050 Calculus of bile duct without cholangitis or cholecystitis without obstruction: Secondary | ICD-10-CM | POA: Diagnosis present

## 2014-01-24 DIAGNOSIS — R1011 Right upper quadrant pain: Secondary | ICD-10-CM | POA: Diagnosis present

## 2014-01-24 DIAGNOSIS — F32A Depression, unspecified: Secondary | ICD-10-CM | POA: Diagnosis present

## 2014-01-24 DIAGNOSIS — K859 Acute pancreatitis without necrosis or infection, unspecified: Principal | ICD-10-CM | POA: Insufficient documentation

## 2014-01-24 DIAGNOSIS — F3289 Other specified depressive episodes: Secondary | ICD-10-CM | POA: Insufficient documentation

## 2014-01-24 DIAGNOSIS — K5289 Other specified noninfective gastroenteritis and colitis: Secondary | ICD-10-CM | POA: Insufficient documentation

## 2014-01-24 DIAGNOSIS — F17200 Nicotine dependence, unspecified, uncomplicated: Secondary | ICD-10-CM | POA: Insufficient documentation

## 2014-01-24 DIAGNOSIS — F329 Major depressive disorder, single episode, unspecified: Secondary | ICD-10-CM | POA: Insufficient documentation

## 2014-01-24 DIAGNOSIS — F411 Generalized anxiety disorder: Secondary | ICD-10-CM | POA: Diagnosis not present

## 2014-01-24 DIAGNOSIS — K8020 Calculus of gallbladder without cholecystitis without obstruction: Secondary | ICD-10-CM | POA: Diagnosis present

## 2014-01-24 DIAGNOSIS — R52 Pain, unspecified: Secondary | ICD-10-CM

## 2014-01-24 DIAGNOSIS — R112 Nausea with vomiting, unspecified: Secondary | ICD-10-CM | POA: Diagnosis present

## 2014-01-24 DIAGNOSIS — Z90710 Acquired absence of both cervix and uterus: Secondary | ICD-10-CM | POA: Diagnosis not present

## 2014-01-24 DIAGNOSIS — F419 Anxiety disorder, unspecified: Secondary | ICD-10-CM

## 2014-01-24 DIAGNOSIS — K851 Biliary acute pancreatitis without necrosis or infection: Secondary | ICD-10-CM

## 2014-01-24 HISTORY — DX: Calculus of gallbladder without cholecystitis without obstruction: K80.20

## 2014-01-24 LAB — CBC WITH DIFFERENTIAL/PLATELET
Basophils Absolute: 0 10*3/uL (ref 0.0–0.1)
Basophils Relative: 0 % (ref 0–1)
Eosinophils Absolute: 0.2 10*3/uL (ref 0.0–0.7)
Eosinophils Relative: 3 % (ref 0–5)
HEMATOCRIT: 37.8 % (ref 36.0–46.0)
HEMOGLOBIN: 13.4 g/dL (ref 12.0–15.0)
LYMPHS PCT: 35 % (ref 12–46)
Lymphs Abs: 2.6 10*3/uL (ref 0.7–4.0)
MCH: 29.9 pg (ref 26.0–34.0)
MCHC: 35.4 g/dL (ref 30.0–36.0)
MCV: 84.4 fL (ref 78.0–100.0)
MONO ABS: 0.3 10*3/uL (ref 0.1–1.0)
Monocytes Relative: 4 % (ref 3–12)
Neutro Abs: 4.4 10*3/uL (ref 1.7–7.7)
Neutrophils Relative %: 58 % (ref 43–77)
Platelets: 244 10*3/uL (ref 150–400)
RBC: 4.48 MIL/uL (ref 3.87–5.11)
RDW: 12 % (ref 11.5–15.5)
WBC: 7.5 10*3/uL (ref 4.0–10.5)

## 2014-01-24 LAB — COMPREHENSIVE METABOLIC PANEL
ALT: 55 U/L — ABNORMAL HIGH (ref 0–35)
AST: 45 U/L — AB (ref 0–37)
Albumin: 3.9 g/dL (ref 3.5–5.2)
Alkaline Phosphatase: 79 U/L (ref 39–117)
Anion gap: 12 (ref 5–15)
BUN: 10 mg/dL (ref 6–23)
CHLORIDE: 103 meq/L (ref 96–112)
CO2: 26 meq/L (ref 19–32)
CREATININE: 0.92 mg/dL (ref 0.50–1.10)
Calcium: 9 mg/dL (ref 8.4–10.5)
GFR calc Af Amer: 88 mL/min — ABNORMAL LOW (ref >90)
GFR, EST NON AFRICAN AMERICAN: 76 mL/min — AB (ref >90)
Glucose, Bld: 118 mg/dL — ABNORMAL HIGH (ref 70–99)
Potassium: 3.7 mEq/L (ref 3.7–5.3)
Sodium: 141 mEq/L (ref 137–147)
Total Protein: 6.4 g/dL (ref 6.0–8.3)

## 2014-01-24 LAB — URINALYSIS, ROUTINE W REFLEX MICROSCOPIC
Bilirubin Urine: NEGATIVE
GLUCOSE, UA: NEGATIVE mg/dL
Hgb urine dipstick: NEGATIVE
Ketones, ur: NEGATIVE mg/dL
Leukocytes, UA: NEGATIVE
Nitrite: NEGATIVE
Protein, ur: NEGATIVE mg/dL
Urobilinogen, UA: 0.2 mg/dL (ref 0.0–1.0)
pH: 5.5 (ref 5.0–8.0)

## 2014-01-24 LAB — RAPID URINE DRUG SCREEN, HOSP PERFORMED
Amphetamines: NOT DETECTED
Barbiturates: NOT DETECTED
Benzodiazepines: NOT DETECTED
Cocaine: NOT DETECTED
OPIATES: NOT DETECTED
TETRAHYDROCANNABINOL: POSITIVE — AB

## 2014-01-24 LAB — LIPASE, BLOOD: Lipase: 279 U/L — ABNORMAL HIGH (ref 11–59)

## 2014-01-24 MED ORDER — POTASSIUM CHLORIDE IN NACL 20-0.9 MEQ/L-% IV SOLN
INTRAVENOUS | Status: AC
  Administered 2014-01-24: 21:00:00 via INTRAVENOUS

## 2014-01-24 MED ORDER — MORPHINE SULFATE 4 MG/ML IJ SOLN
4.0000 mg | INTRAMUSCULAR | Status: DC | PRN
  Administered 2014-01-24: 4 mg via INTRAVENOUS
  Filled 2014-01-24: qty 1

## 2014-01-24 MED ORDER — OXYCODONE-ACETAMINOPHEN 5-325 MG PO TABS
1.0000 | ORAL_TABLET | Freq: Four times a day (QID) | ORAL | Status: DC | PRN
  Administered 2014-01-25 – 2014-01-26 (×3): 2 via ORAL
  Administered 2014-01-26: 1 via ORAL
  Administered 2014-01-26 – 2014-01-27 (×3): 2 via ORAL
  Filled 2014-01-24 (×7): qty 2

## 2014-01-24 MED ORDER — MORPHINE SULFATE 2 MG/ML IJ SOLN
1.0000 mg | INTRAMUSCULAR | Status: DC | PRN
  Administered 2014-01-24 – 2014-01-25 (×4): 1 mg via INTRAVENOUS
  Filled 2014-01-24 (×4): qty 1

## 2014-01-24 MED ORDER — BUPROPION HCL ER (XL) 300 MG PO TB24
300.0000 mg | ORAL_TABLET | Freq: Every day | ORAL | Status: DC
  Filled 2014-01-24 (×4): qty 1

## 2014-01-24 MED ORDER — ONDANSETRON HCL 4 MG/2ML IJ SOLN
4.0000 mg | INTRAMUSCULAR | Status: DC | PRN
  Administered 2014-01-24: 4 mg via INTRAVENOUS
  Filled 2014-01-24: qty 2

## 2014-01-24 MED ORDER — ONDANSETRON HCL 4 MG/2ML IJ SOLN: 4.0000 mg | Freq: Three times a day (TID) | INTRAMUSCULAR | Status: AC | PRN

## 2014-01-24 MED ORDER — SODIUM CHLORIDE 0.9 % IV SOLN
INTRAVENOUS | Status: DC
  Administered 2014-01-24: 19:00:00 via INTRAVENOUS

## 2014-01-24 MED ORDER — ONDANSETRON HCL 4 MG/2ML IJ SOLN: 4.0000 mg | Freq: Four times a day (QID) | INTRAMUSCULAR | Status: DC | PRN

## 2014-01-24 MED ORDER — SODIUM CHLORIDE 0.9 % IV BOLUS (SEPSIS)
1000.0000 mL | Freq: Once | INTRAVENOUS | Status: AC
  Administered 2014-01-24: 1000 mL via INTRAVENOUS

## 2014-01-24 MED ORDER — SODIUM CHLORIDE 0.9 % IV SOLN: INTRAVENOUS | Status: DC

## 2014-01-24 MED ORDER — DIPHENHYDRAMINE HCL 25 MG PO CAPS
25.0000 mg | ORAL_CAPSULE | Freq: Once | ORAL | Status: AC
Start: 2014-01-24 — End: 2014-01-24
  Administered 2014-01-24: 25 mg via ORAL
  Filled 2014-01-24: qty 1

## 2014-01-24 MED ORDER — ONDANSETRON HCL 4 MG PO TABS
4.0000 mg | ORAL_TABLET | Freq: Four times a day (QID) | ORAL | Status: DC | PRN
  Administered 2014-01-26 – 2014-01-27 (×2): 4 mg via ORAL
  Filled 2014-01-24 (×2): qty 1

## 2014-01-24 MED ORDER — HYDROMORPHONE HCL PF 1 MG/ML IJ SOLN
0.5000 mg | INTRAMUSCULAR | Status: AC | PRN
  Administered 2014-01-25 (×2): 0.5 mg via INTRAVENOUS
  Filled 2014-01-24 (×3): qty 1

## 2014-01-24 MED ORDER — ESCITALOPRAM OXALATE 10 MG PO TABS
30.0000 mg | ORAL_TABLET | Freq: Every day | ORAL | Status: DC
Start: 2014-01-24 — End: 2014-01-27
  Administered 2014-01-24 – 2014-01-26 (×3): 30 mg via ORAL
  Filled 2014-01-24 (×3): qty 3

## 2014-01-24 MED ORDER — BUPROPION HCL ER (XL) 150 MG PO TB24
ORAL_TABLET | ORAL | Status: AC
  Filled 2014-01-24: qty 2

## 2014-01-24 MED ORDER — ALPRAZOLAM 0.25 MG PO TABS
0.2500 mg | ORAL_TABLET | Freq: Every evening | ORAL | Status: DC | PRN
  Administered 2014-01-24 – 2014-01-26 (×3): 0.25 mg via ORAL
  Filled 2014-01-24 (×3): qty 1

## 2014-01-24 NOTE — ED Provider Notes (Signed)
CSN: [id number]     Arrival date & time 01/24/14  1708 History   First MD Initiated Contact with Patient 01/24/14 1721     Chief Complaint  Patient presents with  . Abdominal Pain     HPI Pt was seen at 1725.  Per pt, c/o gradual onset and worsening of persistent RUQ abd "pain" for the past 2 to 3 days.  Has been associated with multiple intermittent episodes of N/V. States her emesis has had "streaks of blood in it." Describes the abd pain as "aching."  Pt states she has had similar pain intermittently over the past 1 year with her most recent evaluation 2 days ago in the ED. Pt states she is "getting worse" and is unable to take PO due to her symptoms. States she attempted to f/u with the General Surgeon but "I was getting worse and didn't want to wait for an appointment." Denies diarrhea, no fevers, no back pain, no rash, no CP/SOB, no black or blood in stools.       Past Medical History  Diagnosis Date  . Anxiety   . Depression   . Endometriosis   . Tobacco use   . Colitis   . Cholelithiasis    Past Surgical History  Procedure Laterality Date  . Abdominal hysterectomy  10/23/08  . Cesarean section    . Abdominoplasty     Family History  Problem Relation Age of Onset  . Osteoporosis Mother   . Heart disease Father   . Osteoporosis Father    History  Substance Use Topics  . Smoking status: Current Every Day Smoker -- 0.25 packs/day    Types: Cigarettes  . Smokeless tobacco: Not on file  . Alcohol Use: Yes     Comment: beer occ   OB History   Grav Para Term Preterm Abortions TAB SAB Ect Mult Living   1 1       1 2      Obstetric Comments   Has 3 stepchildren     Review of Systems ROS: Statement: All systems negative except as marked or noted in the HPI; Constitutional: Negative for fever and chills. ; ; Eyes: Negative for eye pain, redness and discharge. ; ; ENMT: Negative for ear pain, hoarseness, nasal congestion, sinus pressure and sore throat. ; ; Cardiovascular:  Negative for chest pain, palpitations, diaphoresis, dyspnea and peripheral edema. ; ; Respiratory: Negative for cough, wheezing and stridor. ; ; Gastrointestinal: +N/V, abd pain. Negative for diarrhea, blood in stool, hematemesis, jaundice and rectal bleeding. . ; ; Genitourinary: Negative for dysuria, flank pain and hematuria. ; ; Musculoskeletal: Negative for back pain and neck pain. Negative for swelling and trauma.; ; Skin: Negative for pruritus, rash, abrasions, blisters, bruising and skin lesion.; ; Neuro: Negative for headache, lightheadedness and neck stiffness. Negative for weakness, altered level of consciousness , altered mental status, extremity weakness, paresthesias, involuntary movement, seizure and syncope.      Allergies  Review of patient's allergies indicates no known allergies.  Home Medications   Prior to Admission medications   Medication Sig Start Date End Date Taking? Authorizing Provider  buPROPion (WELLBUTRIN XL) 300 MG 24 hr tablet Take 300 mg by mouth daily.   Yes Historical Provider, MD  Cyanocobalamin (B-12) 1000 MCG TBCR Take 1 tablet by mouth daily.   Yes Historical Provider, MD  escitalopram (LEXAPRO) 10 MG tablet Take 30 mg by mouth at bedtime. 10/04/11  Yes Lanice Shirts, MD  Estradiol-Estriol-Progesterone (BIEST/PROGESTERONE TD) Place  onto the skin 2 (two) times daily. 4 clicks applied to wither forearm daily (Biest 0.31md (8/2)/ Progest 20/57ml   Yes Historical Provider, MD  L-Glutamine 500 MG CAPS Take 1 capsule by mouth daily. Hold while in hospital   Yes Historical Provider, MD  Magnesium 400 MG CAPS Take 1 tablet by mouth daily. Hold while in hospital   Yes Historical Provider, MD  NON FORMULARY Take 2 tablets by mouth daily. Corticare-B   Yes Historical Provider, MD  oxyCODONE-acetaminophen (PERCOCET/ROXICET) 5-325 MG per tablet Take 1-2 tablets by mouth every 6 (six) hours as needed. 01/22/14  Yes Fredia Sorrow, MD  promethazine (PHENERGAN) 25 MG  tablet Take 1 tablet (25 mg total) by mouth every 6 (six) hours as needed. 01/22/14  Yes Fredia Sorrow, MD  TESTOSTERONE TD Place 0.2 mg onto the skin 3 (three) times a week.   Yes Historical Provider, MD   BP 90/70  Pulse 60  Temp(Src) 99.6 F (37.6 C) (Rectal)  Resp 18  Ht 5\' 6"  (1.676 m)  Wt 145 lb (65.772 kg)  BMI 23.41 kg/m2  SpO2 95% Physical Exam 1730: Physical examination:  Nursing notes reviewed; Vital signs and O2 SAT reviewed;  Constitutional: Well developed, Well nourished, Well hydrated, Uncomfortable appearing.; Head:  Normocephalic, atraumatic; Eyes: EOMI, PERRL, No scleral icterus; ENMT: Mouth and pharynx normal, Mucous membranes moist; Neck: Supple, Full range of motion, No lymphadenopathy; Cardiovascular: Regular rate and rhythm, No murmur, rub, or gallop; Respiratory: Breath sounds clear & equal bilaterally, No rales, rhonchi, wheezes.  Speaking full sentences with ease, Normal respiratory effort/excursion; Chest: Nontender, Movement normal; Abdomen: Soft, +RUQ and mid-epigastric tenderness to palp. No rebound or guarding. Nondistended, Normal bowel sounds; Genitourinary: No CVA tenderness; Extremities: Pulses normal, No tenderness, No edema, No calf edema or asymmetry.; Neuro: AA&Ox3, Major CN grossly intact.  Speech clear. No gross focal motor or sensory deficits in extremities. Climbs on and off stretcher easily by herself. Gait steady.; Skin: Color normal, Warm, Dry.   ED Course  Procedures     MDM  MDM Reviewed: previous chart, nursing note and vitals Reviewed previous: labs, ultrasound and CT scan Interpretation: labs, ultrasound and x-ray   Results for orders placed during the hospital encounter of 01/24/14  URINALYSIS, ROUTINE W REFLEX MICROSCOPIC      Result Value Ref Range   Color, Urine YELLOW  YELLOW   APPearance CLEAR  CLEAR   Specific Gravity, Urine <1.005 (*) 1.005 - 1.030   pH 5.5  5.0 - 8.0   Glucose, UA NEGATIVE  NEGATIVE mg/dL   Hgb urine  dipstick NEGATIVE  NEGATIVE   Bilirubin Urine NEGATIVE  NEGATIVE   Ketones, ur NEGATIVE  NEGATIVE mg/dL   Protein, ur NEGATIVE  NEGATIVE mg/dL   Urobilinogen, UA 0.2  0.0 - 1.0 mg/dL   Nitrite NEGATIVE  NEGATIVE   Leukocytes, UA NEGATIVE  NEGATIVE  CBC WITH DIFFERENTIAL      Result Value Ref Range   WBC 7.5  4.0 - 10.5 K/uL   RBC 4.48  3.87 - 5.11 MIL/uL   Hemoglobin 13.4  12.0 - 15.0 g/dL   HCT 37.8  36.0 - 46.0 %   MCV 84.4  78.0 - 100.0 fL   MCH 29.9  26.0 - 34.0 pg   MCHC 35.4  30.0 - 36.0 g/dL   RDW 12.0  11.5 - 15.5 %   Platelets 244  150 - 400 K/uL   Neutrophils Relative % 58  43 - 77 %  Neutro Abs 4.4  1.7 - 7.7 K/uL   Lymphocytes Relative 35  12 - 46 %   Lymphs Abs 2.6  0.7 - 4.0 K/uL   Monocytes Relative 4  3 - 12 %   Monocytes Absolute 0.3  0.1 - 1.0 K/uL   Eosinophils Relative 3  0 - 5 %   Eosinophils Absolute 0.2  0.0 - 0.7 K/uL   Basophils Relative 0  0 - 1 %   Basophils Absolute 0.0  0.0 - 0.1 K/uL  COMPREHENSIVE METABOLIC PANEL      Result Value Ref Range   Sodium 141  137 - 147 mEq/L   Potassium 3.7  3.7 - 5.3 mEq/L   Chloride 103  96 - 112 mEq/L   CO2 26  19 - 32 mEq/L   Glucose, Bld 118 (*) 70 - 99 mg/dL   BUN 10  6 - 23 mg/dL   Creatinine, Ser 0.92  0.50 - 1.10 mg/dL   Calcium 9.0  8.4 - 10.5 mg/dL   Total Protein 6.4  6.0 - 8.3 g/dL   Albumin 3.9  3.5 - 5.2 g/dL   AST 45 (*) 0 - 37 U/L   ALT 55 (*) 0 - 35 U/L   Alkaline Phosphatase 79  39 - 117 U/L   Total Bilirubin <0.2 (*) 0.3 - 1.2 mg/dL   GFR calc non Af Amer 76 (*) >90 mL/min   GFR calc Af Amer 88 (*) >90 mL/min   Anion gap 12  5 - 15  LIPASE, BLOOD      Result Value Ref Range   Lipase 279 (*) 11 - 59 U/L   Ct Abdomen Pelvis W Contrast 01/22/2014   CLINICAL DATA:  Upper abdominal pain and nausea  EXAM: CT ABDOMEN AND PELVIS WITH CONTRAST  TECHNIQUE: Multidetector CT imaging of the abdomen and pelvis was performed using the standard protocol following bolus administration of intravenous  contrast.  CONTRAST:  156mL OMNIPAQUE IOHEXOL 300 MG/ML  SOLN  COMPARISON:  05/08/2013  FINDINGS: Clear lung bases.  Heart is normal in size.  Normal liver and spleen.  Stable gallstones. No evidence of acute cholecystitis. No bile duct dilation.  Normal pancreas. 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 Normal right adrenal gland.  Sub cm focus of low density in the anterior right kidney below the midpole, likely a small cyst. Kidneys otherwise unremarkable. Normal ureters. Normal bladder.  Uterus is surgically absent.  No pelvic masses.  No adenopathy.  No abnormal fluid collections.  Normal colon and small bowel.  Normal appendix.  Mild degenerative changes noted throughout the visualized spine.  IMPRESSION: 1. No acute findings. No findings to explain abdominal pain or nausea. 2. Cholelithiasis without evidence of acute cholecystitis. 3. Status post hysterectomy. Sub cm low-density right renal lesion, likely a cyst. Mild degenerative changes noted throughout the visualized spine. 4. No other abnormalities.  No change the prior exam.   Electronically Signed   By: Lajean Manes M.D.   On: 01/22/2014 14:44   US Abdomen Limited 01/24/2014   CLINICAL DATA:  RUQ abd pain  EXAM: US ABDOMEN LIMITED - RIGHT UPPER QUADRANT  COMPARISON:  CT 01/22/2014  FINDINGS: Gallbladder:  Multiple stones measuring up to 16 mm diameter. No gallbladder wall thickening or pericholecystic fluid. Sonographer reports no sonographic Murphy's sign.  Common bile duct:  Diameter: 2.2 mm, unremarkable  Liver:  No focal lesion identified. Within normal limits in parenchymal echogenicity.  IMPRESSION: 1. Cholelithiasis without other  ultrasound evidence of cholecystitis or biliary obstruction.   Electronically Signed   By: Arne Cleveland M.D.   On: 01/24/2014 18:57   Dg Abd Acute W/chest 01/24/2014   CLINICAL DATA:  Abdominal pain and diarrhea.  EXAM: ACUTE ABDOMEN SERIES (ABDOMEN 2 VIEW & CHEST 1 VIEW)  COMPARISON:  Chest x-ray from  01/22/2014.  CT from 01/22/2014.  FINDINGS: The lungs are clear without focal consolidation, edema, effusion or pneumothorax. Cardio pericardial silhouette is within normal limits for size. Bilateral nonacute rib fractures again noted.  Insert upper night supine film shows no gaseous bowel dilatation to suggest obstruction. Stone over the right upper quadrant is compatible with a gallstone, as seen on the recent CT scan.  IMPRESSION: 1. No acute cardiopulmonary findings. 2. Normal bowel gas pattern. 3. Cholelithiasis.   Electronically Signed   By: Misty Stanley M.D.   On: 01/24/2014 18:06    Results for JUSTYCE, YEATER (MRN [id number]) as of 01/24/2014 19:11  Ref. Range 01/22/2014 13:14 01/24/2014 18:09  Lipase Latest Range: 11-59 U/L 60 (H) 279 (H)  AST Latest Range: 0-37 U/L 20 45 (H)  ALT Latest Range: 0-35 U/L 19 55 (H)     1915:  LFT's and lipase elevated compared to 2 days ago. [place] with gallstones, but without acute cholecystitis.  Dx and testing d/w pt and family.  Questions answered.  Verb understanding, agreeable to admit. T/C to General Surgery Dr. Arnoldo Morale, case discussed, including:  HPI, pertinent PM/SHx, VS/PE, dx testing, ED course and treatment:  Agreeable to consult, requests to admit to medicine service. T/C to [facility] Dr. Shanon Brow, case discussed, including:  HPI, pertinent PM/SHx, VS/PE, dx testing, ED course and treatment:  Agreeable to admit, requests to write temporary orders, obtain medical bed to team 1.   Alfonzo Feller, DO 01/24/14 2144

## 2014-01-24 NOTE — ED Notes (Addendum)
Pt reports seen for same intermittently x1 year. Pt reports dizziness x2 months as well. Pt reports last seen for this on 01/22/14. Pt reports a flare-up of intense abdominal pain,rectal bleeding and vomiting blood since yesterday. Pt reports "i tried to followup with [facility] for my gallbladder but reports pain is too intense to wait for an appointment." no active bleeding/vomiting noted.

## 2014-01-24 NOTE — ED Notes (Signed)
hemmacult negative

## 2014-01-24 NOTE — H&P (Signed)
Chief Complaint:  abd pain  HPI: 43 yo female with one year h/o episodic ruq abd pain with n/v worse after eating.  She has been told she needs her gallbladder removed, but has not followed up with general surgery as outpt.  This past week she has had a really bad attack, where she cannot eat.  Her ruq pain gets worse after eating and she vomits.  No fevers.  This is her 3 rd ED visit.  Over the last day or so the pain is also in her epigastric area.  Vomit has had streaks of blood.  She has also diarrhea for several months.  Review of Systems:  Positive and negative as per HPI otherwise all other systems are negative  Past Medical History: Past Medical History  Diagnosis Date  . Anxiety   . Depression   . Endometriosis   . Tobacco use   . Colitis   . Cholelithiasis    Past Surgical History  Procedure Laterality Date  . Abdominal hysterectomy  10/23/08  . Cesarean section    . Abdominoplasty      Medications: Prior to Admission medications   Medication Sig Start Date End Date Taking? Authorizing Provider  buPROPion (WELLBUTRIN XL) 300 MG 24 hr tablet Take 300 mg by mouth daily.   Yes Historical Provider, MD  Cyanocobalamin (B-12) 1000 MCG TBCR Take 1 tablet by mouth daily.   Yes Historical Provider, MD  escitalopram (LEXAPRO) 10 MG tablet Take 30 mg by mouth at bedtime. 10/04/11  Yes Lanice Shirts, MD  Estradiol-Estriol-Progesterone (BIEST/PROGESTERONE TD) Place onto the skin 2 (two) times daily. 4 clicks applied to wither forearm daily (Biest 0.81md (8/2)/ Progest 20/31ml   Yes Historical Provider, MD  L-Glutamine 500 MG CAPS Take 1 capsule by mouth daily. Hold while in hospital   Yes Historical Provider, MD  Magnesium 400 MG CAPS Take 1 tablet by mouth daily. Hold while in hospital   Yes Historical Provider, MD  NON FORMULARY Take 2 tablets by mouth daily. Corticare-B   Yes Historical Provider, MD  oxyCODONE-acetaminophen (PERCOCET/ROXICET) 5-325 MG per tablet Take 1-2  tablets by mouth every 6 (six) hours as needed. 01/22/14  Yes Fredia Sorrow, MD  promethazine (PHENERGAN) 25 MG tablet Take 1 tablet (25 mg total) by mouth every 6 (six) hours as needed. 01/22/14  Yes Fredia Sorrow, MD  TESTOSTERONE TD Place 0.2 mg onto the skin 3 (three) times a week.   Yes Historical Provider, MD    Allergies:  No Known Allergies  Social History:  reports that she has been smoking Cigarettes.  She has been smoking about 0.25 packs per day. She does not have any smokeless tobacco history on file. She reports that she drinks alcohol. She reports that she does not use illicit drugs.  Family History: Family History  Problem Relation Age of Onset  . Osteoporosis Mother   . Heart disease Father   . Osteoporosis Father     Physical Exam: Filed Vitals:   01/24/14 1714 01/24/14 1748 01/24/14 1844 01/24/14 1857  BP:  101/67 90/70   Pulse:  60 60   Temp:    99.6 F (37.6 C)  TempSrc:    Rectal  Resp:  18 18   Height: 5\' 6"  (1.676 m)     Weight: 65.772 kg (145 lb)     SpO2:  97% 95%    General appearance: alert, cooperative and no distress Head: Normocephalic, without obvious abnormality, atraumatic Eyes: negative Nose: Nares normal.  Septum midline. Mucosa normal. No drainage or sinus tenderness. Neck: no JVD and supple, symmetrical, trachea midline Lungs: clear to auscultation bilaterally Heart: regular rate and rhythm, S1, S2 normal, no murmur, click, rub or gallop Abdomen: soft, non-tender; bowel sounds normal; no masses,  no organomegaly Extremities: extremities normal, atraumatic, no cyanosis or edema Pulses: 2+ and symmetric Skin: Skin color, texture, turgor normal. No rashes or lesions Neurologic: Grossly normal  Labs on Admission:   Recent Labs  01/22/14 1314 01/24/14 1809  NA 141 141  K 4.3 3.7  CL 102 103  CO2 28 26  GLUCOSE 109* 118*  BUN 19 10  CREATININE 0.80 0.92  CALCIUM 9.1 9.0    Recent Labs  01/22/14 1314 01/24/14 1809  AST  20 45*  ALT 19 55*  ALKPHOS 76 79  BILITOT 0.1* <0.2*  PROT 6.8 6.4  ALBUMIN 4.1 3.9    Recent Labs  01/22/14 1314 01/24/14 1809  LIPASE 60* 279*    Recent Labs  01/22/14 1314 01/24/14 1809  WBC 7.8 7.5  NEUTROABS 6.0 4.4  HGB 13.3 13.4  HCT 38.1 37.8  MCV 85.6 84.4  PLT 237 244   Radiological Exams on Admission: Dg Chest 2 View  01/22/2014   CLINICAL DATA:  43 year old female with vomiting and pain.  EXAM: CHEST  2 VIEW  COMPARISON:  08/11/2013  FINDINGS: The cardiomediastinal silhouette is unremarkable.  There is no evidence of focal airspace disease, pulmonary edema, suspicious pulmonary nodule/mass, pleural effusion, or pneumothorax. No acute bony abnormalities are identified.  Bilateral healing rib fractures are noted.  IMPRESSION: No active cardiopulmonary disease.   Electronically Signed   By: Hassan Rowan M.D.   On: 01/22/2014 14:33   Ct Abdomen Pelvis W Contrast  01/22/2014   CLINICAL DATA:  Upper abdominal pain and nausea  EXAM: CT ABDOMEN AND PELVIS WITH CONTRAST  TECHNIQUE: Multidetector CT imaging of the abdomen and pelvis was performed using the standard protocol following bolus administration of intravenous contrast.  CONTRAST:  166mL OMNIPAQUE IOHEXOL 300 MG/ML  SOLN  COMPARISON:  05/08/2013  FINDINGS: Clear lung bases.  Heart is normal in size.  Normal liver and spleen.  Stable gallstones. No evidence of acute cholecystitis. No bile duct dilation.  Normal pancreas. 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 Normal right adrenal gland.  Sub cm focus of low density in the anterior right kidney below the midpole, likely a small cyst. Kidneys otherwise unremarkable. Normal ureters. Normal bladder.  Uterus is surgically absent.  No pelvic masses.  No adenopathy.  No abnormal fluid collections.  Normal colon and small bowel.  Normal appendix.  Mild degenerative changes noted throughout the visualized spine.  IMPRESSION: 1. No acute findings. No findings to explain  abdominal pain or nausea. 2. Cholelithiasis without evidence of acute cholecystitis. 3. Status post hysterectomy. Sub cm low-density right renal lesion, likely a cyst. Mild degenerative changes noted throughout the visualized spine. 4. No other abnormalities.  No change the prior exam.   Electronically Signed   By: Lajean Manes M.D.   On: 01/22/2014 14:44   US Abdomen Limited  01/24/2014   CLINICAL DATA:  RUQ abd pain  EXAM: US ABDOMEN LIMITED - RIGHT UPPER QUADRANT  COMPARISON:  CT 01/22/2014  FINDINGS: Gallbladder:  Multiple stones measuring up to 16 mm diameter. No gallbladder wall thickening or pericholecystic fluid. Sonographer reports no sonographic Murphy's sign.  Common bile duct:  Diameter: 2.2 mm, unremarkable  Liver:  No focal lesion identified.  Within normal limits in parenchymal echogenicity.  IMPRESSION: 1. Cholelithiasis without other ultrasound evidence of cholecystitis or biliary obstruction.   Electronically Signed   By: Arne Cleveland M.D.   On: 01/24/2014 18:57   Dg Abd Acute W/chest  01/24/2014   CLINICAL DATA:  Abdominal pain and diarrhea.  EXAM: ACUTE ABDOMEN SERIES (ABDOMEN 2 VIEW & CHEST 1 VIEW)  COMPARISON:  Chest x-ray from 01/22/2014.  CT from 01/22/2014.  FINDINGS: The lungs are clear without focal consolidation, edema, effusion or pneumothorax. Cardio pericardial silhouette is within normal limits for size. Bilateral nonacute rib fractures again noted.  Insert upper night supine film shows no gaseous bowel dilatation to suggest obstruction. Stone over the right upper quadrant is compatible with a gallstone, as seen on the recent CT scan.  IMPRESSION: 1. No acute cardiopulmonary findings. 2. Normal bowel gas pattern. 3. Cholelithiasis.   Electronically Signed   By: Misty Stanley M.D.   On: 01/24/2014 18:06    Assessment/Plan  43 yo female with symptomatic cholelithiasis and probable early gallstone pancreatitis  Principal Problem:   Symptomatic cholelithiasis-  Keep npo  in case dr Arnoldo Morale decides to do cholecystecomy tomorrow.  Conservative tx, ivf.  abd exam is benign.  gen surgery consult in am.  Pt has been having trouble with getting to her pcp in the [place] system which she is assigned to one over 2 hours away (in [place]).    Active Problems:   Anxiety   Depression   Abdominal pain, acute, right upper quadrant   Nausea & vomiting    Carrigan Delafuente A 01/24/2014, 7:32 PM

## 2014-01-25 DIAGNOSIS — K859 Acute pancreatitis without necrosis or infection, unspecified: Secondary | ICD-10-CM

## 2014-01-25 LAB — COMPREHENSIVE METABOLIC PANEL
ALBUMIN: 3.5 g/dL (ref 3.5–5.2)
ALK PHOS: 75 U/L (ref 39–117)
ALT: 49 U/L — AB (ref 0–35)
ANION GAP: 6 (ref 5–15)
AST: 40 U/L — ABNORMAL HIGH (ref 0–37)
BILIRUBIN TOTAL: 0.2 mg/dL — AB (ref 0.3–1.2)
BUN: 8 mg/dL (ref 6–23)
CHLORIDE: 109 meq/L (ref 96–112)
CO2: 29 mEq/L (ref 19–32)
Calcium: 8.5 mg/dL (ref 8.4–10.5)
Creatinine, Ser: 0.96 mg/dL (ref 0.50–1.10)
GFR calc Af Amer: 83 mL/min — ABNORMAL LOW (ref >90)
GFR calc non Af Amer: 72 mL/min — ABNORMAL LOW (ref >90)
Glucose, Bld: 90 mg/dL (ref 70–99)
POTASSIUM: 4.1 meq/L (ref 3.7–5.3)
SODIUM: 144 meq/L (ref 137–147)
Total Protein: 5.9 g/dL — ABNORMAL LOW (ref 6.0–8.3)

## 2014-01-25 LAB — CBC
HEMATOCRIT: 39.1 % (ref 36.0–46.0)
HEMOGLOBIN: 13.4 g/dL (ref 12.0–15.0)
MCH: 29.7 pg (ref 26.0–34.0)
MCHC: 34.3 g/dL (ref 30.0–36.0)
MCV: 86.7 fL (ref 78.0–100.0)
Platelets: 240 10*3/uL (ref 150–400)
RBC: 4.51 MIL/uL (ref 3.87–5.11)
RDW: 12.2 % (ref 11.5–15.5)
WBC: 6.5 10*3/uL (ref 4.0–10.5)

## 2014-01-25 LAB — LIPASE, BLOOD: Lipase: 171 U/L — ABNORMAL HIGH (ref 11–59)

## 2014-01-25 MED ORDER — ENOXAPARIN SODIUM 40 MG/0.4ML ~~LOC~~ SOLN
40.0000 mg | SUBCUTANEOUS | Status: DC
  Administered 2014-01-25: 40 mg via SUBCUTANEOUS
  Filled 2014-01-25: qty 0.4

## 2014-01-25 MED ORDER — NICOTINE 7 MG/24HR TD PT24
7.0000 mg | MEDICATED_PATCH | Freq: Every day | TRANSDERMAL | Status: DC
  Filled 2014-01-25 (×5): qty 1

## 2014-01-25 MED ORDER — HYDROMORPHONE HCL PF 1 MG/ML IJ SOLN
0.5000 mg | INTRAMUSCULAR | Status: DC | PRN
  Administered 2014-01-25 – 2014-01-27 (×9): 0.5 mg via INTRAVENOUS
  Filled 2014-01-25 (×10): qty 1

## 2014-01-25 MED ORDER — BUPROPION HCL ER (XL) 300 MG PO TB24
300.0000 mg | ORAL_TABLET | Freq: Every day | ORAL | Status: DC
  Administered 2014-01-25: 300 mg via ORAL
  Filled 2014-01-25 (×5): qty 1

## 2014-01-25 MED ORDER — PANTOPRAZOLE SODIUM 40 MG PO TBEC
40.0000 mg | DELAYED_RELEASE_TABLET | Freq: Every day | ORAL | Status: DC
  Administered 2014-01-25 – 2014-01-26 (×2): 40 mg via ORAL
  Filled 2014-01-25 (×2): qty 1

## 2014-01-25 MED ORDER — ZOLPIDEM TARTRATE 5 MG PO TABS
5.0000 mg | ORAL_TABLET | Freq: Once | ORAL | Status: AC
  Administered 2014-01-25: 5 mg via ORAL
  Filled 2014-01-25: qty 1

## 2014-01-25 NOTE — Progress Notes (Signed)
  PROGRESS NOTE  Samantha Lyons PPI:[id number] DOB: [birth year] DOA: 01/24/2014 PCP: [facility] over [facility] hours away (in [place]).    Summary: 43 year old woman presented with recurrent, episodic, right upper quadrant abdominal pain worsened by eating. Seen in the ER several days ago and referred to surgery as an outpatient.  Assessment/Plan: 1. Biliary colic/symptomatic cholelithiasis. Pain control. No evidence at this time of acute cholecystitis. 2. Gallstone pancreatitis. Improving with supportive care.  3. Anxiety, depression. Stable. 4. Tobacco dependence   Discussed with Dr. Arnoldo Morale. Plan for cholecystectomy in the next 48 hours. Continue current management with supportive care. Repeat laboratory studies in the morning.  Code Status: full cdoe DVT prophylaxis: Lovenox Family Communication: none present Disposition Plan: home  Murray Hodgkins, MD  [facility]  Pager [phone] If 7PM-7AM, please contact night-coverage at [email or web address], password [facility] 01/25/2014, 1:06 PM  LOS: 1 day   Consultants:  General surgery  Procedures:    Antibiotics:    HPI/Subjective: Feeling better today. Pain well-controlled. Tolerating liquids.  Objective: Filed Vitals:   01/24/14 1947 01/24/14 2100 01/25/14 0541 01/25/14 0627  BP: 111/75 112/54 70/49 126/90  Pulse: 53 50 72   Temp:  98.2 F (36.8 C) 98.6 F (37 C)   TempSrc:   Oral   Resp: 18 18 18    Height:  5\' 6"  (1.676 m)    Weight:  77.021 kg (169 lb 12.8 oz)    SpO2: 100% 100% 99%    No intake or output data in the 24 hours ending 01/25/14 1306   Filed Weights   01/24/14 1714 01/24/14 2100  Weight: 65.772 kg (145 lb) 77.021 kg (169 lb 12.8 oz)    Exam:     Afebrile, vital signs stable. No hypoxia. Gen. Appears calm and comfortable.  Psych. Alert. Speech fluent and clear.  Cardiovascular. Regular rate and rhythm. No murmur, rub gallop.  Respiratory. Clear to  auscultation bilaterally. No wheezes, rales or rhonchi. Normal respiratory effort.  Abdomen. Soft.  Data Reviewed:  Chemistry: Lipase significantly improved, 171. AST, ALT somewhat improved. Alkaline phosphatase and total bilirubin normal. Basic metabolic panel unremarkable.  Heme: CBC unremarkable.  ID: Urinalysis negative  Imaging: Ultrasound shows cholelithiasis without evidence of biliary obstruction or cholecystitis. Acute abdominal series unremarkable.  Scheduled Meds: . sodium chloride   Intravenous STAT  . buPROPion  300 mg Oral Daily  . enoxaparin (LOVENOX) injection  40 mg Subcutaneous Q24H  . escitalopram  30 mg Oral QHS  . nicotine  7 mg Transdermal Daily  . pantoprazole  40 mg Oral Q1200   Continuous Infusions:   Principal Problem:   Symptomatic cholelithiasis Active Problems:   Anxiety   Depression   Abdominal pain, acute, right upper quadrant   Nausea & vomiting   Biliary colic   Time spent 20 minutes

## 2014-01-25 NOTE — Consult Note (Signed)
Reason for Consult: Gallstone pancreatitis Referring Physician: Hospitalist  Samantha Lyons is an 43 y.o. female.  HPI: Patient is a 43 year old white female who presents with right upper quadrant abdominal pain, nausea, and hematemesis. She states she has had this intermittently over the past few weeks. She'll have episodes of right upper quadrant abdominal and epigastric pain which causes her to be nauseated. She starts throwing up and then has trace blood in the emesis. She denies any fever, chills or jaundice. She presented to the emergency room and was found to have an elevated lipase. Her liver enzyme tests are slightly elevated. Ultrasound of gallbladder reveals cholelithiasis with normal common bile duct and no significant Murphy's sign.  Past Medical History  Diagnosis Date  . Anxiety   . Depression   . Endometriosis   . Tobacco use   . Colitis   . Cholelithiasis     Past Surgical History  Procedure Laterality Date  . Abdominal hysterectomy  10/23/08  . Cesarean section    . Abdominoplasty      Family History  Problem Relation Age of Onset  . Osteoporosis Mother   . Heart disease Father   . Osteoporosis Father     Social History:  reports that she has been smoking Cigarettes.  She has been smoking about 0.25 packs per day. She does not have any smokeless tobacco history on file. She reports that she drinks alcohol. She reports that she uses illicit drugs (Marijuana).  Allergies: No Known Allergies  Medications: I have reviewed the patient's current medications.  Results for orders placed during the hospital encounter of 01/24/14 (from the past 48 hour(s))  URINALYSIS, ROUTINE W REFLEX MICROSCOPIC     Status: Abnormal   Collection Time    01/24/14  5:54 PM      Result Value Ref Range   Color, Urine YELLOW  YELLOW   APPearance CLEAR  CLEAR   Specific Gravity, Urine <1.005 (*) 1.005 - 1.030   pH 5.5  5.0 - 8.0   Glucose, UA NEGATIVE  NEGATIVE mg/dL   Hgb urine  dipstick NEGATIVE  NEGATIVE   Bilirubin Urine NEGATIVE  NEGATIVE   Ketones, ur NEGATIVE  NEGATIVE mg/dL   Protein, ur NEGATIVE  NEGATIVE mg/dL   Urobilinogen, UA 0.2  0.0 - 1.0 mg/dL   Nitrite NEGATIVE  NEGATIVE   Leukocytes, UA NEGATIVE  NEGATIVE   Comment: MICROSCOPIC NOT DONE ON URINES WITH NEGATIVE PROTEIN, BLOOD, LEUKOCYTES, NITRITE, OR GLUCOSE <1000 mg/dL.  URINE RAPID DRUG SCREEN (HOSP PERFORMED)     Status: Abnormal   Collection Time    01/24/14  5:54 PM      Result Value Ref Range   Opiates NONE DETECTED  NONE DETECTED   Cocaine NONE DETECTED  NONE DETECTED   Benzodiazepines NONE DETECTED  NONE DETECTED   Amphetamines NONE DETECTED  NONE DETECTED   Tetrahydrocannabinol POSITIVE (*) NONE DETECTED   Barbiturates NONE DETECTED  NONE DETECTED   Comment:            DRUG SCREEN FOR MEDICAL PURPOSES     ONLY.  IF CONFIRMATION IS NEEDED     FOR ANY PURPOSE, NOTIFY LAB     WITHIN 5 DAYS.                LOWEST DETECTABLE LIMITS     FOR URINE DRUG SCREEN     Drug Class       Cutoff (ng/mL)     Amphetamine  1000     Barbiturate      200     Benzodiazepine   093     Tricyclics       818     Opiates          300     Cocaine          300     THC              50  CBC WITH DIFFERENTIAL     Status: None   Collection Time    01/24/14  6:09 PM      Result Value Ref Range   WBC 7.5  4.0 - 10.5 K/uL   RBC 4.48  3.87 - 5.11 MIL/uL   Hemoglobin 13.4  12.0 - 15.0 g/dL   HCT 37.8  36.0 - 46.0 %   MCV 84.4  78.0 - 100.0 fL   MCH 29.9  26.0 - 34.0 pg   MCHC 35.4  30.0 - 36.0 g/dL   RDW 12.0  11.5 - 15.5 %   Platelets 244  150 - 400 K/uL   Neutrophils Relative % 58  43 - 77 %   Neutro Abs 4.4  1.7 - 7.7 K/uL   Lymphocytes Relative 35  12 - 46 %   Lymphs Abs 2.6  0.7 - 4.0 K/uL   Monocytes Relative 4  3 - 12 %   Monocytes Absolute 0.3  0.1 - 1.0 K/uL   Eosinophils Relative 3  0 - 5 %   Eosinophils Absolute 0.2  0.0 - 0.7 K/uL   Basophils Relative 0  0 - 1 %   Basophils Absolute  0.0  0.0 - 0.1 K/uL  COMPREHENSIVE METABOLIC PANEL     Status: Abnormal   Collection Time    01/24/14  6:09 PM      Result Value Ref Range   Sodium 141  137 - 147 mEq/L   Potassium 3.7  3.7 - 5.3 mEq/L   Chloride 103  96 - 112 mEq/L   CO2 26  19 - 32 mEq/L   Glucose, Bld 118 (*) 70 - 99 mg/dL   BUN 10  6 - 23 mg/dL   Creatinine, Ser 0.92  0.50 - 1.10 mg/dL   Calcium 9.0  8.4 - 10.5 mg/dL   Total Protein 6.4  6.0 - 8.3 g/dL   Albumin 3.9  3.5 - 5.2 g/dL   AST 45 (*) 0 - 37 U/L   ALT 55 (*) 0 - 35 U/L   Alkaline Phosphatase 79  39 - 117 U/L   Total Bilirubin <0.2 (*) 0.3 - 1.2 mg/dL   GFR calc non Af Amer 76 (*) >90 mL/min   GFR calc Af Amer 88 (*) >90 mL/min   Comment: (NOTE)     The eGFR has been calculated using the CKD EPI equation.     This calculation has not been validated in all clinical situations.     eGFR's persistently <90 mL/min signify possible Chronic Kidney     Disease.   Anion gap 12  5 - 15  LIPASE, BLOOD     Status: Abnormal   Collection Time    01/24/14  6:09 PM      Result Value Ref Range   Lipase 279 (*) 11 - 59 U/L  COMPREHENSIVE METABOLIC PANEL     Status: Abnormal   Collection Time    01/25/14  6:02 AM      Result Value  Ref Range   Sodium 144  137 - 147 mEq/L   Potassium 4.1  3.7 - 5.3 mEq/L   Chloride 109  96 - 112 mEq/L   CO2 29  19 - 32 mEq/L   Glucose, Bld 90  70 - 99 mg/dL   BUN 8  6 - 23 mg/dL   Creatinine, Ser 0.96  0.50 - 1.10 mg/dL   Calcium 8.5  8.4 - 10.5 mg/dL   Total Protein 5.9 (*) 6.0 - 8.3 g/dL   Albumin 3.5  3.5 - 5.2 g/dL   AST 40 (*) 0 - 37 U/L   ALT 49 (*) 0 - 35 U/L   Alkaline Phosphatase 75  39 - 117 U/L   Total Bilirubin 0.2 (*) 0.3 - 1.2 mg/dL   GFR calc non Af Amer 72 (*) >90 mL/min   GFR calc Af Amer 83 (*) >90 mL/min   Comment: (NOTE)     The eGFR has been calculated using the CKD EPI equation.     This calculation has not been validated in all clinical situations.     eGFR's persistently <90 mL/min signify  possible Chronic Kidney     Disease.   Anion gap 6  5 - 15  CBC     Status: None   Collection Time    01/25/14  6:02 AM      Result Value Ref Range   WBC 6.5  4.0 - 10.5 K/uL   RBC 4.51  3.87 - 5.11 MIL/uL   Hemoglobin 13.4  12.0 - 15.0 g/dL   HCT 39.1  36.0 - 46.0 %   MCV 86.7  78.0 - 100.0 fL   MCH 29.7  26.0 - 34.0 pg   MCHC 34.3  30.0 - 36.0 g/dL   RDW 12.2  11.5 - 15.5 %   Platelets 240  150 - 400 K/uL  LIPASE, BLOOD     Status: Abnormal   Collection Time    01/25/14  6:02 AM      Result Value Ref Range   Lipase 171 (*) 11 - 59 U/L    US Abdomen Limited  01/24/2014   CLINICAL DATA:  RUQ abd pain  EXAM: US ABDOMEN LIMITED - RIGHT UPPER QUADRANT  COMPARISON:  CT 01/22/2014  FINDINGS: Gallbladder:  Multiple stones measuring up to 16 mm diameter. No gallbladder wall thickening or pericholecystic fluid. Sonographer reports no sonographic Murphy's sign.  Common bile duct:  Diameter: 2.2 mm, unremarkable  Liver:  No focal lesion identified. Within normal limits in parenchymal echogenicity.  IMPRESSION: 1. Cholelithiasis without other ultrasound evidence of cholecystitis or biliary obstruction.   Electronically Signed   By: Arne Cleveland M.D.   On: 01/24/2014 18:57   Dg Abd Acute W/chest  01/24/2014   CLINICAL DATA:  Abdominal pain and diarrhea.  EXAM: ACUTE ABDOMEN SERIES (ABDOMEN 2 VIEW & CHEST 1 VIEW)  COMPARISON:  Chest x-ray from 01/22/2014.  CT from 01/22/2014.  FINDINGS: The lungs are clear without focal consolidation, edema, effusion or pneumothorax. Cardio pericardial silhouette is within normal limits for size. Bilateral nonacute rib fractures again noted.  Insert upper night supine film shows no gaseous bowel dilatation to suggest obstruction. Stone over the right upper quadrant is compatible with a gallstone, as seen on the recent CT scan.  IMPRESSION: 1. No acute cardiopulmonary findings. 2. Normal bowel gas pattern. 3. Cholelithiasis.   Electronically Signed   By: Misty Stanley M.D.   On: 01/24/2014 18:06  ROS: See chart Blood pressure 126/90, pulse 72, temperature 98.6 F (37 C), temperature source Oral, resp. rate 18, height '5\' 6"'  (1.676 m), weight 77.021 kg (169 lb 12.8 oz), SpO2 99.00%. Physical Exam: Well-developed well-nourished white female in no acute distress. Abdomen is soft with minimal tenderness in right upper quadrant. She does have some epigastric discomfort. No hepatosplenomegaly, masses, or rigidity are noted.  Assessment/Plan: Impression: Gallstone pancreatitis, resolving Plan: We'll continue to monitor her pancreatitis. Temporarily schedule her for laparoscopic cholecystectomy on 01/27/2014. The risks and benefits of the procedure including bleeding, infection, hepatobiliary injury, the possibility of an open procedure were fully explained to the patient, who gave informed consent. Will advance diet as tolerated. Will check lipase and liver enzyme tests in a.m.  Tarina Volk A 01/25/2014, 9:51 AM

## 2014-01-26 DIAGNOSIS — K851 Biliary acute pancreatitis without necrosis or infection: Secondary | ICD-10-CM

## 2014-01-26 LAB — LIPASE, BLOOD: LIPASE: 27 U/L (ref 11–59)

## 2014-01-26 LAB — HEPATIC FUNCTION PANEL
ALT: 55 U/L — ABNORMAL HIGH (ref 0–35)
AST: 55 U/L — ABNORMAL HIGH (ref 0–37)
Albumin: 3.6 g/dL (ref 3.5–5.2)
Alkaline Phosphatase: 77 U/L (ref 39–117)
Bilirubin, Direct: <0.2 mg/dL (ref 0.0–0.3)
TOTAL PROTEIN: 6.3 g/dL (ref 6.0–8.3)
Total Bilirubin: 0.3 mg/dL (ref 0.3–1.2)

## 2014-01-26 MED ORDER — KCL IN DEXTROSE-NACL 20-5-0.45 MEQ/L-%-% IV SOLN
INTRAVENOUS | Status: DC
  Administered 2014-01-26 – 2014-01-27 (×3): via INTRAVENOUS

## 2014-01-26 MED ORDER — ZOLPIDEM TARTRATE 5 MG PO TABS
5.0000 mg | ORAL_TABLET | Freq: Every evening | ORAL | Status: AC | PRN
  Administered 2014-01-26: 5 mg via ORAL
  Filled 2014-01-26: qty 1

## 2014-01-26 MED ORDER — ZOLPIDEM TARTRATE 5 MG PO TABS: 5.0000 mg | ORAL_TABLET | Freq: Every evening | ORAL | Status: DC | PRN

## 2014-01-26 NOTE — Progress Notes (Signed)
  PROGRESS NOTE  CHRISTE TELLEZ GYF:[id number] DOB: [birth year] DOA: 01/24/2014 PCP: [facility] which [facility] one over 2 hours away (in [place]).   Summary: 43 year old woman presented with recurrent, episodic, right upper quadrant abdominal pain worsened by eating. Seen in the ER several days ago and referred to surgery as an outpatient.  Assessment/Plan: 1. Biliary colic/symptomatic cholelithiasis. Pain well controlled. No evidence of cholecystitis. 2. Gallstone pancreatitis. Appears clinically resolved. Lipase has normalized.  3. Anxiety, depression. Stable. 4. Tobacco dependence   Plan per Dr. Arnoldo Morale, laparoscopic cholecystectomy 7/27.  Stop Lovenox. NPO after midnight.  Code Status: full cdoe DVT prophylaxis: Lovenox Family Communication: none present Disposition Plan: home  Murray Hodgkins, MD  [facility]  Pager [phone] If 7PM-7AM, please contact night-coverage at [email or web address], password [facility] 01/26/2014, 5:31 PM  LOS: 2 days   Consultants:  General surgery  Procedures:    Antibiotics:    HPI/Subjective: Pain is well-controlled. Tolerating liquids. No vomiting. No complaints.  Objective: Filed Vitals:   01/25/14 0627 01/25/14 1429 01/25/14 2041 01/26/14 0510  BP: 126/90 95/60 104/50 96/64  Pulse:  58 58 60  Temp:  97.8 F (36.6 C) 97.7 F (36.5 C) 97.6 F (36.4 C)  TempSrc:  Oral Oral Oral  Resp:  18 18 18   Height:      Weight:      SpO2:  99% 100% 100%    Intake/Output Summary (Last 24 hours) at 01/26/14 1731 Last data filed at 01/26/14 1347  Gross per 24 hour  Intake    720 ml  Output      0 ml  Net    720 ml     Filed Weights   01/24/14 1714 01/24/14 2100  Weight: 65.772 kg (145 lb) 77.021 kg (169 lb 12.8 oz)    Exam:     Afebrile, VSS. No hypoxia. Gen. Appears comfortable, calm. Psych. Alert. Speech fluent and clear. Cardiovascular. Regular rate and rhythm. No murmur, gallop, rub. Respiratory.  Clear to auscultation bilaterally. No wheezes, rales or rhonchi. Normal respiratory effort.  Data Reviewed:  Chemistry: Lipase has normalized. Transaminases without significant change. Total bilirubin and alkaline phosphatase normal.  Scheduled Meds: . buPROPion  300 mg Oral Daily  . enoxaparin (LOVENOX) injection  40 mg Subcutaneous Q24H  . escitalopram  30 mg Oral QHS  . nicotine  7 mg Transdermal Daily  . pantoprazole  40 mg Oral Q1200   Continuous Infusions: . dextrose 5 % and 0.45 % NaCl with KCl 20 mEq/L 75 mL/hr at 01/26/14 1700    Principal Problem:   Gallstone pancreatitis Active Problems:   Anxiety   Depression   Abdominal pain, acute, right upper quadrant   Symptomatic cholelithiasis   Nausea & vomiting   Biliary colic   Time spent 15 minutes

## 2014-01-26 NOTE — Progress Notes (Signed)
Lipase is improved. Patient denies any significant abdominal pain. We'll proceed with laparoscopic cholecystectomy tomorrow. The risks and benefits of the procedure including bleeding, infection, hepatobiliary injury, and the possibility of an open procedure were fully explained to the patient, who gave informed consent.

## 2014-01-27 ENCOUNTER — Encounter (HOSPITAL_COMMUNITY): Admitting: Anesthesiology

## 2014-01-27 ENCOUNTER — Encounter (HOSPITAL_COMMUNITY): Payer: Self-pay | Admitting: *Deleted

## 2014-01-27 ENCOUNTER — Encounter (HOSPITAL_COMMUNITY)
Admission: EM | Disposition: A | Discharge status: Home / Self Care | Payer: Self-pay | Source: Home / Self Care | Attending: Emergency Medicine

## 2014-01-27 ENCOUNTER — Observation Stay (HOSPITAL_COMMUNITY): Admitting: Anesthesiology

## 2014-01-27 HISTORY — PX: CHOLECYSTECTOMY: SHX55

## 2014-01-27 LAB — OCCULT BLOOD, POC DEVICE: FECAL OCCULT BLD: NEGATIVE

## 2014-01-27 LAB — SURGICAL PCR SCREEN
MRSA, PCR: NEGATIVE
Staphylococcus aureus: NEGATIVE

## 2014-01-27 SURGERY — LAPAROSCOPIC CHOLECYSTECTOMY
Anesthesia: General | Site: Abdomen

## 2014-01-27 MED ORDER — ONDANSETRON HCL 4 MG/2ML IJ SOLN: 4.0000 mg | Freq: Once | INTRAMUSCULAR | Status: DC | PRN

## 2014-01-27 MED ORDER — ROCURONIUM BROMIDE 100 MG/10ML IV SOLN
INTRAVENOUS | Status: DC | PRN
  Administered 2014-01-27: 30 mg via INTRAVENOUS

## 2014-01-27 MED ORDER — MIDAZOLAM HCL 2 MG/2ML IJ SOLN
1.0000 mg | INTRAMUSCULAR | Status: DC | PRN
  Administered 2014-01-27: 2 mg via INTRAVENOUS

## 2014-01-27 MED ORDER — DEXAMETHASONE SODIUM PHOSPHATE 4 MG/ML IJ SOLN
4.0000 mg | Freq: Once | INTRAMUSCULAR | Status: AC
  Administered 2014-01-27: 4 mg via INTRAVENOUS

## 2014-01-27 MED ORDER — ONDANSETRON HCL 4 MG/2ML IJ SOLN
4.0000 mg | Freq: Once | INTRAMUSCULAR | Status: AC
  Administered 2014-01-27: 4 mg via INTRAVENOUS

## 2014-01-27 MED ORDER — DEXAMETHASONE SODIUM PHOSPHATE 4 MG/ML IJ SOLN
INTRAMUSCULAR | Status: AC
  Filled 2014-01-27: qty 1

## 2014-01-27 MED ORDER — GLYCOPYRROLATE 0.2 MG/ML IJ SOLN
INTRAMUSCULAR | Status: AC
  Filled 2014-01-27: qty 2

## 2014-01-27 MED ORDER — CHLORHEXIDINE GLUCONATE 4 % EX LIQD
1.0000 "application " | Freq: Once | CUTANEOUS | Status: DC
  Filled 2014-01-27: qty 15

## 2014-01-27 MED ORDER — LACTATED RINGERS IV SOLN
INTRAVENOUS | Status: DC
  Administered 2014-01-27: 10:00:00 via INTRAVENOUS

## 2014-01-27 MED ORDER — NEOSTIGMINE METHYLSULFATE 10 MG/10ML IV SOLN
INTRAVENOUS | Status: DC | PRN
  Administered 2014-01-27 (×2): 2 mg via INTRAVENOUS

## 2014-01-27 MED ORDER — MIDAZOLAM HCL 2 MG/2ML IJ SOLN
INTRAMUSCULAR | Status: AC
  Filled 2014-01-27: qty 2

## 2014-01-27 MED ORDER — SODIUM CHLORIDE 0.9 % IR SOLN
Status: DC | PRN
  Administered 2014-01-27: 1000 mL

## 2014-01-27 MED ORDER — BUPIVACAINE HCL (PF) 0.5 % IJ SOLN
INTRAMUSCULAR | Status: AC
  Filled 2014-01-27: qty 30

## 2014-01-27 MED ORDER — CIPROFLOXACIN IN D5W 400 MG/200ML IV SOLN
400.0000 mg | INTRAVENOUS | Status: AC
  Administered 2014-01-27: 400 mg via INTRAVENOUS
  Filled 2014-01-27: qty 200

## 2014-01-27 MED ORDER — HEMOSTATIC AGENTS (NO CHARGE) OPTIME
TOPICAL | Status: DC | PRN
  Administered 2014-01-27: 1 via TOPICAL

## 2014-01-27 MED ORDER — MIDAZOLAM HCL 5 MG/5ML IJ SOLN
INTRAMUSCULAR | Status: DC | PRN
  Administered 2014-01-27: 2 mg via INTRAVENOUS

## 2014-01-27 MED ORDER — LIDOCAINE HCL 1 % IJ SOLN
INTRAMUSCULAR | Status: DC | PRN
  Administered 2014-01-27: 50 mg via INTRADERMAL

## 2014-01-27 MED ORDER — KETOROLAC TROMETHAMINE 30 MG/ML IJ SOLN
30.0000 mg | Freq: Once | INTRAMUSCULAR | Status: AC
  Administered 2014-01-27: 30 mg via INTRAVENOUS
  Filled 2014-01-27: qty 1

## 2014-01-27 MED ORDER — FENTANYL CITRATE 0.05 MG/ML IJ SOLN
INTRAMUSCULAR | Status: AC
  Filled 2014-01-27: qty 2

## 2014-01-27 MED ORDER — LACTATED RINGERS IV SOLN: INTRAVENOUS | Status: DC

## 2014-01-27 MED ORDER — BUPIVACAINE HCL (PF) 0.5 % IJ SOLN
INTRAMUSCULAR | Status: DC | PRN
  Administered 2014-01-27: 10 mL

## 2014-01-27 MED ORDER — PROPOFOL 10 MG/ML IV EMUL
INTRAVENOUS | Status: AC
  Filled 2014-01-27: qty 20

## 2014-01-27 MED ORDER — GLYCOPYRROLATE 0.2 MG/ML IJ SOLN
INTRAMUSCULAR | Status: DC | PRN
  Administered 2014-01-27: 0.4 mg via INTRAVENOUS
  Administered 2014-01-27: 0.2 mg via INTRAVENOUS

## 2014-01-27 MED ORDER — PROPOFOL 10 MG/ML IV BOLUS
INTRAVENOUS | Status: DC | PRN
  Administered 2014-01-27: 150 mg via INTRAVENOUS

## 2014-01-27 MED ORDER — OXYCODONE-ACETAMINOPHEN 5-325 MG PO TABS: 1.0000 | ORAL_TABLET | Freq: Four times a day (QID) | ORAL | Status: DC | PRN

## 2014-01-27 MED ORDER — ONDANSETRON HCL 4 MG/2ML IJ SOLN
INTRAMUSCULAR | Status: AC
  Filled 2014-01-27: qty 2

## 2014-01-27 MED ORDER — DEXTROSE 5 % IV SOLN
INTRAVENOUS | Status: DC | PRN
  Administered 2014-01-27: 10:00:00 via INTRAVENOUS

## 2014-01-27 MED ORDER — FENTANYL CITRATE 0.05 MG/ML IJ SOLN
INTRAMUSCULAR | Status: AC
  Filled 2014-01-27: qty 5

## 2014-01-27 MED ORDER — FENTANYL CITRATE 0.05 MG/ML IJ SOLN
25.0000 ug | INTRAMUSCULAR | Status: DC | PRN
  Administered 2014-01-27 (×2): 50 ug via INTRAVENOUS
  Filled 2014-01-27 (×2): qty 2

## 2014-01-27 MED ORDER — FENTANYL CITRATE 0.05 MG/ML IJ SOLN
INTRAMUSCULAR | Status: DC | PRN
  Administered 2014-01-27: 50 ug via INTRAVENOUS
  Administered 2014-01-27: 100 ug via INTRAVENOUS
  Administered 2014-01-27 (×3): 50 ug via INTRAVENOUS

## 2014-01-27 MED ORDER — POVIDONE-IODINE 10 % EX OINT
TOPICAL_OINTMENT | CUTANEOUS | Status: AC
  Filled 2014-01-27: qty 1

## 2014-01-27 SURGICAL SUPPLY — 44 items
APPLIER CLIP LAPSCP 10X32 DD (CLIP) ×3 IMPLANT
BAG HAMPER (MISCELLANEOUS) ×3 IMPLANT
BLADE 11 SAFETY STRL DISP (BLADE) ×3 IMPLANT
CLOTH BEACON ORANGE TIMEOUT ST (SAFETY) ×3 IMPLANT
COVER LIGHT HANDLE STERIS (MISCELLANEOUS) ×6 IMPLANT
DECANTER SPIKE VIAL GLASS SM (MISCELLANEOUS) ×3 IMPLANT
DERMABOND ADVANCED (GAUZE/BANDAGES/DRESSINGS) ×2
DERMABOND ADVANCED .7 DNX12 (GAUZE/BANDAGES/DRESSINGS) ×1 IMPLANT
DURAPREP 26ML APPLICATOR (WOUND CARE) ×3 IMPLANT
ELECT REM PT RETURN 9FT ADLT (ELECTROSURGICAL) ×3
ELECTRODE REM PT RTRN 9FT ADLT (ELECTROSURGICAL) ×1 IMPLANT
FILTER SMOKE EVAC LAPAROSHD (FILTER) ×3 IMPLANT
FORMALIN 10 PREFIL 120ML (MISCELLANEOUS) ×3 IMPLANT
GLOVE BIOGEL PI IND STRL 7.0 (GLOVE) ×2 IMPLANT
GLOVE BIOGEL PI INDICATOR 7.0 (GLOVE) ×4
GLOVE ECLIPSE 6.5 STRL STRAW (GLOVE) ×3 IMPLANT
GLOVE SS BIOGEL STRL SZ 6.5 (GLOVE) ×1 IMPLANT
GLOVE SUPERSENSE BIOGEL SZ 6.5 (GLOVE) ×2
GLOVE SURG SS PI 7.5 STRL IVOR (GLOVE) ×3 IMPLANT
GOWN STRL REUS W/TWL LRG LVL3 (GOWN DISPOSABLE) ×9 IMPLANT
HEMOSTAT SNOW SURGICEL 2X4 (HEMOSTASIS) ×3 IMPLANT
INST SET LAPROSCOPIC AP (KITS) ×3 IMPLANT
IV NS IRRIG 3000ML ARTHROMATIC (IV SOLUTION) IMPLANT
KIT ROOM TURNOVER APOR (KITS) ×3 IMPLANT
MANIFOLD NEPTUNE II (INSTRUMENTS) ×3 IMPLANT
NEEDLE INSUFFLATION 14GA 120MM (NEEDLE) ×3 IMPLANT
NS IRRIG 1000ML POUR BTL (IV SOLUTION) ×3 IMPLANT
PACK LAP CHOLE LZT030E (CUSTOM PROCEDURE TRAY) ×3 IMPLANT
PAD ARMBOARD 7.5X6 YLW CONV (MISCELLANEOUS) ×3 IMPLANT
POUCH SPECIMEN RETRIEVAL 10MM (ENDOMECHANICALS) ×3 IMPLANT
SET BASIN LINEN APH (SET/KITS/TRAYS/PACK) ×3 IMPLANT
SET TUBE IRRIG SUCTION NO TIP (IRRIGATION / IRRIGATOR) IMPLANT
SLEEVE ENDOPATH XCEL 5M (ENDOMECHANICALS) ×3 IMPLANT
SPONGE GAUZE 2X2 8PLY STER LF (GAUZE/BANDAGES/DRESSINGS) ×4
SPONGE GAUZE 2X2 8PLY STRL LF (GAUZE/BANDAGES/DRESSINGS) ×8 IMPLANT
STAPLER VISISTAT (STAPLE) ×3 IMPLANT
SUT VIC AB 4-0 PS2 27 (SUTURE) ×9 IMPLANT
SUT VICRYL 0 UR6 27IN ABS (SUTURE) ×3 IMPLANT
TROCAR ENDO BLADELESS 11MM (ENDOMECHANICALS) ×3 IMPLANT
TROCAR XCEL NON-BLD 5MMX100MML (ENDOMECHANICALS) ×3 IMPLANT
TROCAR XCEL UNIV SLVE 11M 100M (ENDOMECHANICALS) ×3 IMPLANT
TUBING INSUFFLATION (TUBING) ×3 IMPLANT
WARMER LAPAROSCOPE (MISCELLANEOUS) ×3 IMPLANT
YANKAUER SUCT 12FT TUBE ARGYLE (SUCTIONS) ×3 IMPLANT

## 2014-01-27 NOTE — Anesthesia Postprocedure Evaluation (Signed)
  Anesthesia Post-op Note  Patient: Samantha Lyons  Procedure(s) Performed: Procedure(s): LAPAROSCOPIC CHOLECYSTECTOMY (N/A)  Patient Location: PACU  Anesthesia Type:General  Level of Consciousness: awake, alert , oriented and patient cooperative  Airway and Oxygen Therapy: Patient Spontanous Breathing  Post-op Pain: 3 /10, mild  Post-op Assessment: Post-op Vital signs reviewed, Patient's Cardiovascular Status Stable, Respiratory Function Stable, Patent Airway and Pain level controlled  Post-op Vital Signs: Reviewed and stable  Last Vitals:  Filed Vitals:   01/27/14 1010  BP: 111/66  Pulse:   Temp:   Resp: 21    Complications: No apparent anesthesia complications

## 2014-01-27 NOTE — Progress Notes (Signed)
Patient back to room via bed from OR, surgical sites dry and intact.

## 2014-01-27 NOTE — Transfer of Care (Signed)
Immediate Anesthesia Transfer of Care Note  Patient: Samantha Lyons  Procedure(s) Performed: Procedure(s): LAPAROSCOPIC CHOLECYSTECTOMY (N/A)  Patient Location: PACU  Anesthesia Type:General  Level of Consciousness: awake and patient cooperative  Airway & Oxygen Therapy: Patient Spontanous Breathing and Patient connected to face mask oxygen  Post-op Assessment: Report given to PACU RN, Post -op Vital signs reviewed and stable and Patient moving all extremities  Post vital signs: Reviewed and stable  Complications: No apparent anesthesia complications

## 2014-01-27 NOTE — Op Note (Signed)
Patient:  Samantha Lyons  DOB:  [birth year]  MRN:  [id number]   Preop Diagnosis:  Gallstone pancreatitis  Postop Diagnosis:  Same  Procedure:  Laparoscopic cholecystectomy  Surgeon:  Aviva Signs, M.D.  Anes:  General endotracheal  Indications:  Patient is a 43 year old white female presents with gallstone pancreatitis. The risks and benefits of the procedure including bleeding, infection, hepatobiliary injury, the possibility of an open procedure were fully explained to the patient, who gave informed consent.  Procedure note:  The patient is placed the supine position. After induction of general endotracheal anesthesia, the abdomen was prepped and draped using usual sterile technique with DuraPrep. Surgical site confirmation was performed.  A supraumbilical incision was made down to the fascia. A Veress needle was introduced into the abdominal cavity and confirmation of placement was done using the saline drop test. The abdomen was then insufflated to 16 mm mercury pressure. An 11 mm trocar was introduced into the abdominal cavity under direct visualization without difficulty. The patient was placed in reverse Trendelenburg position and additional 11 mm trocar was placed the epigastric region and 5 mm trochars were placed the right upper quadrant and right flank regions. Liver was inspected and noted within normal limits. The gallbladder was retracted in a dynamic fashion in order to expose the triangle of Calot. The cystic duct was first identified. Junction to the infundibulum was fully identified. Endoclips were placed proximally distally on the cystic duct, and the cystic duct was divided. This was likewise done cystic artery. The gallbladder was then freed away from the gallbladder fossa using Bovie electrocautery. The gallbladder was delivered through the epigastric trocar site using an Endo Catch bag. The gallbladder fossa was inspected and no abnormal bleeding or bile leakage was noted.  Surgicel is placed the gallbladder fossa. All fluid and air were then evacuated from the abdominal cavity prior to removal of the trochars.  All wounds were irrigated with normal saline. All wounds were injected with 0.5% Sensorcaine. The supraumbilical fascia was reapproximated using 0 Vicryl interrupted suture. All skin incisions were closed using staples. Betadine ointment and dry sterile dressings were applied.  All tape and needle counts were correct at the end of the procedure. Patient was extubated in the operating room and transferred to PACU in stable condition.  Complications:  None  EBL:  Minimal  Specimen:  Gallbladder

## 2014-01-27 NOTE — Progress Notes (Signed)
Patient states understanding of discharge instructions,prescription given. Patient has ambulated, eaten and urinated since arrival to floor. Patient states relief from pain with medication given

## 2014-01-27 NOTE — Care Management Note (Unsigned)
    Page 1 of 1   01/27/2014     2:56:06 PM CARE MANAGEMENT NOTE 01/27/2014  Patient:  Samantha Lyons, Samantha Lyons   Account Number:  [id number]  Date Initiated:  01/27/2014  Documentation initiated by:  Vladimir Creeks  Subjective/Objective Assessment:   pt in surgery for cholecystectomy     Action/Plan:   Anticipated DC Date:  01/28/2014   Anticipated DC Plan:  [place]  CM consult      Choice offered to / List presented to:             Status of service:  In process, will continue to follow Medicare Important Message given?   (If response is "NO", the following Medicare IM given date fields will be blank) Date Medicare IM given:   Medicare IM given by:   Date Additional Medicare IM given:   Additional Medicare IM given by:    Discharge Disposition:    Per UR Regulation:  Reviewed for med. necessity/level of care/duration of stay  If discussed at [place] of Stay Meetings, dates discussed:    Comments:  01/27/14 Vladimir Creeks RN/CM

## 2014-01-27 NOTE — Anesthesia Procedure Notes (Signed)
Procedure Name: Intubation Date/Time: 01/27/2014 10:26 AM Performed by: Charmaine Downs Pre-anesthesia Checklist: Suction available, Patient being monitored, Emergency Drugs available and Patient identified Patient Re-evaluated:Patient Re-evaluated prior to inductionOxygen Delivery Method: Circle system utilized Preoxygenation: Pre-oxygenation with 100% oxygen Intubation Type: IV induction Ventilation: Mask ventilation without difficulty and Oral airway inserted - appropriate to patient size Laryngoscope Size: Mac and 3 Grade View: Grade I Tube type: Oral Tube size: 7.0 mm Number of attempts: 1 Airway Equipment and Method: Stylet Placement Confirmation: ETT inserted through vocal cords under direct vision,  positive ETCO2 and breath sounds checked- equal and bilateral Secured at: 22 cm Tube secured with: Tape Dental Injury: Teeth and Oropharynx as per pre-operative assessment

## 2014-01-27 NOTE — Anesthesia Preprocedure Evaluation (Signed)
Anesthesia Evaluation  Patient identified by MRN, date of birth, ID band Patient awake    Reviewed: Allergy & Precautions, H&P , NPO status , Patient's Chart, lab work & pertinent test results  Airway Mallampati: I TM Distance: >3 FB Neck ROM: Full    Dental  (+) Teeth Intact   Pulmonary Current Smoker,  breath sounds clear to auscultation        Cardiovascular negative cardio ROS  Rhythm:Regular Rate:Normal     Neuro/Psych PSYCHIATRIC DISORDERS Anxiety Depression    GI/Hepatic   Endo/Other    Renal/GU      Musculoskeletal   Abdominal   Peds  Hematology   Anesthesia Other Findings   Reproductive/Obstetrics                           Anesthesia Physical Anesthesia Plan  ASA: III  Anesthesia Plan: General   Post-op Pain Management:    Induction: Intravenous  Airway Management Planned: Oral ETT  Additional Equipment:   Intra-op Plan:   Post-operative Plan: Extubation in OR  Informed Consent: I have reviewed the patients History and Physical, chart, labs and discussed the procedure including the risks, benefits and alternatives for the proposed anesthesia with the patient or authorized representative who has indicated his/her understanding and acceptance.     Plan Discussed with:   Anesthesia Plan Comments:         Anesthesia Quick Evaluation

## 2014-01-27 NOTE — Discharge Instructions (Signed)

## 2014-01-28 ENCOUNTER — Encounter (HOSPITAL_COMMUNITY): Payer: Self-pay | Admitting: General Surgery

## 2014-01-29 NOTE — Discharge Summary (Signed)
Physician Discharge Summary  Patient ID: Samantha Lyons MRN: [id number] DOB/AGE: 1970-09-08 43 y.o.  Admit date: 01/24/2014 Discharge date: 01/27/2014  Admission Diagnoses: Gallstone pancreatitis  Discharge Diagnoses: Same  `                    Principal Problem:   Gallstone pancreatitis Active Problems:   Anxiety   Depression   Abdominal pain, acute, right upper quadrant   Symptomatic cholelithiasis   Nausea & vomiting   Biliary colic   Discharged Condition: good  Hospital Course: Patient is a 43 year old white female who presented to emergency room with worsening epigastric right upper quadrant abdominal pain. She was noted have gallstone pancreatitis. She was admitted to the hospital for further evaluation treatment. She subsequently underwent laparoscopic cholecystectomy on 01/27/2014. She tolerated the procedure well. Postoperative course was unremarkable. She was discharged home on 01/27/2014 good improving condition.  Treatments: surgery: Laparoscopic cholecystectomy on 01/27/2014  Discharge Exam: Blood pressure 123/87, pulse 93, temperature 98.3 F (36.8 C), temperature source Oral, resp. rate 18, height 5\' 6"  (1.676 m), weight 76.658 kg (169 lb), SpO2 94.00%. General appearance: alert, cooperative and no distress Resp: clear to auscultation bilaterally Cardio: regular rate and rhythm, S1, S2 normal, no murmur, click, rub or gallop GI: Soft. Dressings dry and intact.  Disposition: 01-Home or Self Care     Medication List         B-12 1000 MCG Tbcr  Take 1 tablet by mouth daily.     BIEST/PROGESTERONE TD  Place onto the skin 2 (two) times daily. 4 clicks applied to wither forearm daily (Biest 0.13md (8/2)/ Progest 20/74ml     buPROPion 300 MG 24 hr tablet  Commonly known as:  WELLBUTRIN XL  Take 300 mg by mouth daily.     escitalopram 10 MG tablet  Commonly known as:  LEXAPRO  Take 30 mg by mouth at bedtime.     L-Glutamine 500 MG Caps  Take 1 capsule by mouth  daily. Hold while in hospital     Magnesium 400 MG Caps  Take 1 tablet by mouth daily. Hold while in hospital     NON FORMULARY  Take 2 tablets by mouth daily. Corticare-B     oxyCODONE-acetaminophen 5-325 MG per tablet  Commonly known as:  PERCOCET/ROXICET  Take 1-2 tablets by mouth every 6 (six) hours as needed.     promethazine 25 MG tablet  Commonly known as:  PHENERGAN  Take 1 tablet (25 mg total) by mouth every 6 (six) hours as needed.     TESTOSTERONE TD  Place 0.2 mg onto the skin 3 (three) times a week.           Follow-up Information   Follow up with [name], MD. Schedule an appointment as soon as possible for a visit on 02/11/2014.   Specialty:  General Surgery   Contact information:   [id number] [place] [address] [phone]       Signed: Aviva Signs A 01/29/2014, 1:45 PM

## 2014-05-05 ENCOUNTER — Encounter (HOSPITAL_COMMUNITY): Payer: Self-pay | Admitting: General Surgery

## 2015-04-24 ENCOUNTER — Encounter: Payer: Self-pay | Admitting: Gastroenterology

## 2015-04-24 ENCOUNTER — Telehealth: Payer: Self-pay | Admitting: Family Medicine

## 2015-04-24 ENCOUNTER — Encounter: Payer: Self-pay | Admitting: Family Medicine

## 2015-04-24 ENCOUNTER — Ambulatory Visit (INDEPENDENT_AMBULATORY_CARE_PROVIDER_SITE_OTHER): Admitting: Family Medicine

## 2015-04-24 VITALS — BP 105/69 | HR 68 | Temp 97.9°F | Ht 66.0 in | Wt 158.8 lb

## 2015-04-24 DIAGNOSIS — R1084 Generalized abdominal pain: Secondary | ICD-10-CM | POA: Diagnosis not present

## 2015-04-24 DIAGNOSIS — F329 Major depressive disorder, single episode, unspecified: Secondary | ICD-10-CM | POA: Diagnosis not present

## 2015-04-24 DIAGNOSIS — F419 Anxiety disorder, unspecified: Secondary | ICD-10-CM

## 2015-04-24 DIAGNOSIS — R197 Diarrhea, unspecified: Secondary | ICD-10-CM

## 2015-04-24 DIAGNOSIS — Z23 Encounter for immunization: Secondary | ICD-10-CM

## 2015-04-24 DIAGNOSIS — Z7189 Other specified counseling: Secondary | ICD-10-CM | POA: Diagnosis not present

## 2015-04-24 DIAGNOSIS — Z7689 Persons encountering health services in other specified circumstances: Secondary | ICD-10-CM | POA: Insufficient documentation

## 2015-04-24 DIAGNOSIS — F32A Depression, unspecified: Secondary | ICD-10-CM

## 2015-04-24 DIAGNOSIS — R5383 Other fatigue: Secondary | ICD-10-CM | POA: Insufficient documentation

## 2015-04-24 DIAGNOSIS — R159 Full incontinence of feces: Secondary | ICD-10-CM

## 2015-04-24 DIAGNOSIS — Z720 Tobacco use: Secondary | ICD-10-CM

## 2015-04-24 DIAGNOSIS — N809 Endometriosis, unspecified: Secondary | ICD-10-CM

## 2015-04-24 DIAGNOSIS — Z0000 Encounter for general adult medical examination without abnormal findings: Secondary | ICD-10-CM | POA: Insufficient documentation

## 2015-04-24 DIAGNOSIS — R109 Unspecified abdominal pain: Secondary | ICD-10-CM | POA: Insufficient documentation

## 2015-04-24 HISTORY — DX: Full incontinence of feces: R15.9

## 2015-04-24 LAB — COMPREHENSIVE METABOLIC PANEL
ALK PHOS: 75 U/L (ref 39–117)
ALT: 25 U/L (ref 0–35)
AST: 26 U/L (ref 0–37)
Albumin: 4.3 g/dL (ref 3.5–5.2)
BUN: 10 mg/dL (ref 6–23)
CO2: 26 mEq/L (ref 19–32)
Calcium: 9.6 mg/dL (ref 8.4–10.5)
Chloride: 106 mEq/L (ref 96–112)
Creatinine, Ser: 0.98 mg/dL (ref 0.40–1.20)
GFR: 65.59 mL/min (ref >60.00)
Glucose, Bld: 84 mg/dL (ref 70–99)
Potassium: 4.6 mEq/L (ref 3.5–5.1)
Sodium: 141 mEq/L (ref 135–145)
TOTAL PROTEIN: 6.8 g/dL (ref 6.0–8.3)
Total Bilirubin: 0.4 mg/dL (ref 0.2–1.2)

## 2015-04-24 LAB — CBC WITH DIFFERENTIAL/PLATELET
BASOS PCT: 0.3 % (ref 0.0–3.0)
Basophils Absolute: 0 10*3/uL (ref 0.0–0.1)
EOS PCT: 2.5 % (ref 0.0–5.0)
Eosinophils Absolute: 0.2 10*3/uL (ref 0.0–0.7)
HCT: 39.2 % (ref 36.0–46.0)
HEMOGLOBIN: 13.1 g/dL (ref 12.0–15.0)
Lymphocytes Relative: 30.1 % (ref 12.0–46.0)
Lymphs Abs: 2.9 10*3/uL (ref 0.7–4.0)
MCHC: 33.5 g/dL (ref 30.0–36.0)
MCV: 88.5 fl (ref 78.0–100.0)
MONO ABS: 0.7 10*3/uL (ref 0.1–1.0)
Monocytes Relative: 7 % (ref 3.0–12.0)
NEUTROS ABS: 5.8 10*3/uL (ref 1.4–7.7)
NEUTROS PCT: 60.1 % (ref 43.0–77.0)
Platelets: 303 10*3/uL (ref 150.0–400.0)
RBC: 4.43 Mil/uL (ref 3.87–5.11)
RDW: 12.9 % (ref 11.5–15.5)
WBC: 9.7 10*3/uL (ref 4.0–10.5)

## 2015-04-24 LAB — TSH: TSH: 1.75 u[IU]/mL (ref 0.35–4.50)

## 2015-04-24 MED ORDER — ESCITALOPRAM OXALATE 10 MG PO TABS: 30.0000 mg | ORAL_TABLET | Freq: Every day | ORAL | Status: DC

## 2015-04-24 MED ORDER — CLONAZEPAM 1 MG PO TABS: 1.0000 mg | ORAL_TABLET | Freq: Two times a day (BID) | ORAL | Status: DC | PRN

## 2015-04-24 MED ORDER — BUPROPION HCL ER (XL) 300 MG PO TB24: 300.0000 mg | ORAL_TABLET | Freq: Every day | ORAL | Status: DC

## 2015-04-24 NOTE — Progress Notes (Signed)
Subjective:    Patient ID: Samantha Lyons, female    DOB: 1971-07-01, 44 y.o.   MRN: [id number]  HPI  Patient presents for new patient establishment with multiple compaints. All past medical history, surgical history, allergies, family history, immunizations and social history was obtained from the patient today and entered into the electronic medical record. Records are requested from her prior PCP, and will be reviewed at the time they are received. All medical records will be updated at that time.  Anxiety: Patient with long-standing anxiety and depression, had seen psychiatry in the past. Is attempting to try to get into a new psychiatrist, and has been bleeding messages, without a call back from the office. Patient has been on Wellbutrin 300 mg daily, Klonopin 1 mg twice a day when necessary, Lexapro 30 mg daily at bedtime. Patient is wondering if we can give refills on medications until she becomes established with a new psychiatrist.  Abd pain: Patient reports intermittent chronic abdominal pain. History is extremely difficult to obtain, patient is a very poor historian and tangential in thought and speech. Patient reports intermittent pain with defecation, feels like a spasm. She states that hurts all over her abdomen when this occurs. She has a history of colitis. She states she has experienced blood per rectum a few weeks ago. She states that his chest about a tablespoon of just blood that came out of her rectum. She states that it hurts at times to pass stool. She endorses fecal incontinence, stating that she has had 2 episodes within the last few weeks. Patient has never been diagnosed with irritable bowel syndrome or irritable bowel disease. She does endorse nausea and vomiting intermittently. She states she "doesn't eat that often". Patient has not had any weight loss. No family history of Crohn's or ulcerative colitis. Maternal grandmother with colon cancer.  Tobacco abuse: Patient  continues to smoke, and does desire smoking cessation or counseling at this time.  Health maintenance:  Colonoscopy: Patient has never had a colonoscopy, history of colitis? Mammogram: 2013 mammogram negative. Patient reports recent mammogram, records are requested. Cervical cancer screening: Patient uncertain when her last pelvic exam was, patient has had a hysterectomy. Immunizations: Td 2013, flu indicated, patient uncertain on other immunizations. Infectious disease screening: Uncertain screenings, records have been requested.   Past Medical History  Diagnosis Date  . Anxiety   . Depression   . Endometriosis   . Tobacco use   . Colitis   . Cholelithiasis    No Known Allergies Past Surgical History  Procedure Laterality Date  . Abdominal hysterectomy  10/23/08  . Cesarean section    . Abdominoplasty    . Cholecystectomy N/A 01/27/2014    Procedure: LAPAROSCOPIC CHOLECYSTECTOMY;  Surgeon: [name], MD;  Location: [facility];  Service: General;  Laterality: N/A;   Family History  Problem Relation Age of Onset  . Osteoporosis Mother   . Heart disease Father   . Osteoporosis Father    Social History   Social History  . Marital Status: Married    Spouse Name: N/A  . Number of Children: N/A  . Years of Education: N/A   Occupational History  . Not on file.   Social History Main Topics  . Smoking status: Current Every Day Smoker -- 0.25 packs/day    Types: Cigarettes  . Smokeless tobacco: Not on file  . Alcohol Use: Yes     Comment: beer occ  . Drug Use: Yes  Special: Marijuana  . Sexual Activity: Yes    Birth Control/ Protection: Surgical   Other Topics Concern  . Not on file   Social History Narrative   Review of Systems Negative, with the exception of above mentioned in HPI     Objective:   Physical Exam BP 105/69 mmHg  Pulse 68  Temp(Src) 97.9 F (36.6 C) (Oral)  Ht _0  (1.676 m)  Wt 158 lb 12 oz (72.009 kg)  BMI 25.64 kg/m2  SpO2 99% Gen:  Afebrile. No acute distress. Nontoxic in appearance, Caucasian female. Extremely talkative. HENT: AT. [place]. Bilateral TM visualized and normal in appearance. MMM.  Eyes:Pupils Equal Round Reactive to light, Extraocular movements intact,  Conjunctiva without redness, discharge or icterus. Neck/lymp/endocrine: Supple, no lymphadenopathy, no thyromegaly CV: RRR no murmur appreciated, no edema, +2/4 P posterior tibialis pulses Chest: CTAB, no wheeze or crackles Abd: Soft. Flat. NTND. BS present/hyperactive. No Masses palpated.  Skin: No rashes, purpura or petechiae.  Neuro:  Normal gait. PERLA. EOMi. Alert. Oriented x3. Psych: Anxious, fidgety, psychomotor agitation. Normal dress. Extremely talkative, tangential speech and thought. Mild flight of ideas. Needs consistent redirection.      Assessment & Plan:  1. Encounter to establish care 2. Anxiety  3. Depression - Prescribed Klonopin 1 mg twice a day when necessary, with one refill. - Prescribed Wellbutrin 300 mg daily. - Prescribed Lexapro 30 mg daily at bedtime - Patient will need to establish with psychiatry, to receive medications from them in the future.  4. Tobacco use - Patient declined smoking cessation  5. Generalized abdominal pain - Very difficult to obtain history from patient, abdominal pain appears chronic, occasional fecal incontinence and occasional blood per rectum (not currently) - CBC and CMP collected today, referral to gastroenterology placed for thorough GI evaluation. - CBC w/Diff - Comp Met (CMET)  6. Other fatigue - CBC, CMP, TSH  7. Diarrhea, unspecified type - We'll check TSH, given patient's history of diarrhea versus fecal incontinence have placed gastroenterology referral today. Symptoms are intermittent, and not currently present. Likely IBS related, will need to rule out irritable bowel disease, patient likely will need colonoscopy. - TSH  8. Need for prophylactic vaccination and inoculation against  influenza - Flu Vaccine QUAD 36+ mos PF IM (Fluarix & Fluzone Quad PF)  9. Fecal incontinence - Per above  10. Endometriosis - History of endometriosis, unknown last pelvic exam, patient also receiving "bioidentical treatment" with compound creams. She doesn't know exactly name of the compounds that she is using. Patient today that I do not prescribe hormonal therapy of any type, and she will need a referral to a gynecologist to establish all of the above. Patient agreement with plan for referral to gynecology today.  11. Healthcare maintenance Health maintenance:  Colonoscopy: Patient has never had a colonoscopy, history of colitis? Mammogram: 2013 mammogram negative. Patient reports recent mammogram, records are requested. Cervical cancer screening: Patient uncertain when her last pelvic exam was, patient has had a hysterectomy. Immunizations: Td 2013, flu indicated, patient uncertain on other immunizations. Infectious disease screening: Uncertain screenings, records have been requested.   Greater than 45 minutes was spent with patient, greater than 50% of that time was spent face-to-face with patient counseling and coordinating care.

## 2015-04-24 NOTE — Telephone Encounter (Signed)
Please call patient called her lab work results are normal.

## 2015-04-24 NOTE — Patient Instructions (Signed)
Health Maintenance, Female Adopting a healthy lifestyle and getting preventive care can go a long way to promote health and wellness. Talk with your health care provider about what schedule of regular examinations is right for you. This is a good chance for you to check in with your provider about disease prevention and staying healthy. In between checkups, there are plenty of things you can do on your own. Experts have done a lot of research about which lifestyle changes and preventive measures are most likely to keep you healthy. Ask your health care provider for more information. WEIGHT AND DIET  Eat a healthy diet  Be sure to include plenty of vegetables, fruits, low-fat dairy products, and lean protein.  Do not eat a lot of foods high in solid fats, added sugars, or salt.  Get regular exercise. This is one of the most important things you can do for your health.  Most adults should exercise for at least 150 minutes each week. The exercise should increase your heart rate and make you sweat (moderate-intensity exercise).  Most adults should also do strengthening exercises at least twice a week. This is in addition to the moderate-intensity exercise.  Maintain a healthy weight  Body mass index (BMI) is a measurement that can be used to identify possible weight problems. It estimates body fat based on height and weight. Your health care provider can help determine your BMI and help you achieve or maintain a healthy weight.  For females 20 years of age and older:   A BMI below 18.5 is considered underweight.  A BMI of 18.5 to 24.9 is normal.  A BMI of 25 to 29.9 is considered overweight.  A BMI of 30 and above is considered obese.  Watch levels of cholesterol and blood lipids  You should start having your blood tested for lipids and cholesterol at 43 years of age, then have this test every 5 years.  You may need to have your cholesterol levels checked more often if:  Your lipid  or cholesterol levels are high.  You are older than 43 years of age.  You are at high risk for heart disease.  CANCER SCREENING   Lung Cancer  Lung cancer screening is recommended for adults 55-80 years old who are at high risk for lung cancer because of a history of smoking.  A yearly low-dose CT scan of the lungs is recommended for people who:  Currently smoke.  Have quit within the past 15 years.  Have at least a 30-pack-year history of smoking. A pack year is smoking an average of one pack of cigarettes a day for 1 year.  Yearly screening should continue until it has been 15 years since you quit.  Yearly screening should stop if you develop a health problem that would prevent you from having lung cancer treatment.  Breast Cancer  Practice breast self-awareness. This means understanding how your breasts normally appear and feel.  It also means doing regular breast self-exams. Let your health care provider know about any changes, no matter how small.  If you are in your 20s or 30s, you should have a clinical breast exam (CBE) by a health care provider every 1-3 years as part of a regular health exam.  If you are 40 or older, have a CBE every year. Also consider having a breast X-ray (mammogram) every year.  If you have a family history of breast cancer, talk to your health care provider about genetic screening.  If you   are at high risk for breast cancer, talk to your health care provider about having an MRI and a mammogram every year.  Breast cancer gene (BRCA) assessment is recommended for women who have family members with BRCA-related cancers. BRCA-related cancers include:  Breast.  Ovarian.  Tubal.  Peritoneal cancers.  Results of the assessment will determine the need for genetic counseling and BRCA1 and BRCA2 testing. Cervical Cancer Your health care provider may recommend that you be screened regularly for cancer of the pelvic organs (ovaries, uterus, and  vagina). This screening involves a pelvic examination, including checking for microscopic changes to the surface of your cervix (Pap test). You may be encouraged to have this screening done every 3 years, beginning at age 21.  For women ages 30-65, health care providers may recommend pelvic exams and Pap testing every 3 years, or they may recommend the Pap and pelvic exam, combined with testing for human papilloma virus (HPV), every 5 years. Some types of HPV increase your risk of cervical cancer. Testing for HPV may also be done on women of any age with unclear Pap test results.  Other health care providers may not recommend any screening for nonpregnant women who are considered low risk for pelvic cancer and who do not have symptoms. Ask your health care provider if a screening pelvic exam is right for you.  If you have had past treatment for cervical cancer or a condition that could lead to cancer, you need Pap tests and screening for cancer for at least 20 years after your treatment. If Pap tests have been discontinued, your risk factors (such as having a new sexual partner) need to be reassessed to determine if screening should resume. Some women have medical problems that increase the chance of getting cervical cancer. In these cases, your health care provider may recommend more frequent screening and Pap tests. Colorectal Cancer  This type of cancer can be detected and often prevented.  Routine colorectal cancer screening usually begins at 43 years of age and continues through 43 years of age.  Your health care provider may recommend screening at an earlier age if you have risk factors for colon cancer.  Your health care provider may also recommend using home test kits to check for hidden blood in the stool.  A small camera at the end of a tube can be used to examine your colon directly (sigmoidoscopy or colonoscopy). This is done to check for the earliest forms of colorectal  cancer.  Routine screening usually begins at age 50.  Direct examination of the colon should be repeated every 5-10 years through 43 years of age. However, you may need to be screened more often if early forms of precancerous polyps or small growths are found. Skin Cancer  Check your skin from head to toe regularly.  Tell your health care provider about any new moles or changes in moles, especially if there is a change in a mole's shape or color.  Also tell your health care provider if you have a mole that is larger than the size of a pencil eraser.  Always use sunscreen. Apply sunscreen liberally and repeatedly throughout the day.  Protect yourself by wearing long sleeves, pants, a wide-brimmed hat, and sunglasses whenever you are outside. HEART DISEASE, DIABETES, AND HIGH BLOOD PRESSURE   High blood pressure causes heart disease and increases the risk of stroke. High blood pressure is more likely to develop in:  People who have blood pressure in the high end   of the normal range (130-139/85-89 mm Hg).  People who are overweight or obese.  People who are African American.  If you are 38-23 years of age, have your blood pressure checked every 3-5 years. If you are 61 years of age or older, have your blood pressure checked every year. You should have your blood pressure measured twice--once when you are at a hospital or clinic, and once when you are not at a hospital or clinic. Record the average of the two measurements. To check your blood pressure when you are not at a hospital or clinic, you can use:  An automated blood pressure machine at a pharmacy.  A home blood pressure monitor.  If you are between 45 years and 39 years old, ask your health care provider if you should take aspirin to prevent strokes.  Have regular diabetes screenings. This involves taking a blood sample to check your fasting blood sugar level.  If you are at a normal weight and have a low risk for diabetes,  have this test once every three years after 43 years of age.  If you are overweight and have a high risk for diabetes, consider being tested at a younger age or more often. PREVENTING INFECTION  Hepatitis B  If you have a higher risk for hepatitis B, you should be screened for this virus. You are considered at high risk for hepatitis B if:  You were born in a country where hepatitis B is common. Ask your health care provider which countries are considered high risk.  Your parents were born in a high-risk country, and you have not been immunized against hepatitis B (hepatitis B vaccine).  You have HIV or AIDS.  You use needles to inject street drugs.  You live with someone who has hepatitis B.  You have had sex with someone who has hepatitis B.  You get hemodialysis treatment.  You take certain medicines for conditions, including cancer, organ transplantation, and autoimmune conditions. Hepatitis C  Blood testing is recommended for:  Everyone born from [birth year] through 1964.  Anyone with known risk factors for hepatitis C. Sexually transmitted infections (STIs)  You should be screened for sexually transmitted infections (STIs) including gonorrhea and chlamydia if:  You are sexually active and are younger than 43 years of age.  You are older than 43 years of age and your health care provider tells you that you are at risk for this type of infection.  Your sexual activity has changed since you were last screened and you are at an increased risk for chlamydia or gonorrhea. Ask your health care provider if you are at risk.  If you do not have HIV, but are at risk, it may be recommended that you take a prescription medicine daily to prevent HIV infection. This is called pre-exposure prophylaxis (PrEP). You are considered at risk if:  You are sexually active and do not regularly use condoms or know the HIV status of your partner(s).  You take drugs by injection.  You are sexually  active with a partner who has HIV. Talk with your health care provider about whether you are at high risk of being infected with HIV. If you choose to begin PrEP, you should first be tested for HIV. You should then be tested every 3 months for as long as you are taking PrEP.  PREGNANCY   If you are premenopausal and you may become pregnant, ask your health care provider about preconception counseling.  If you may  become pregnant, take 400 to 800 micrograms (mcg) of folic acid every day.  If you want to prevent pregnancy, talk to your health care provider about birth control (contraception). OSTEOPOROSIS AND MENOPAUSE   Osteoporosis is a disease in which the bones lose minerals and strength with aging. This can result in serious bone fractures. Your risk for osteoporosis can be identified using a bone density scan.  If you are 63 years of age or older, or if you are at risk for osteoporosis and fractures, ask your health care provider if you should be screened.  Ask your health care provider whether you should take a calcium or vitamin D supplement to lower your risk for osteoporosis.  Menopause may have certain physical symptoms and risks.  Hormone replacement therapy may reduce some of these symptoms and risks. Talk to your health care provider about whether hormone replacement therapy is right for you.  HOME CARE INSTRUCTIONS   Schedule regular health, dental, and eye exams.  Stay current with your immunizations.   Do not use any tobacco products including cigarettes, chewing tobacco, or electronic cigarettes.  If you are pregnant, do not drink alcohol.  If you are breastfeeding, limit how much and how often you drink alcohol.  Limit alcohol intake to no more than 1 drink per day for nonpregnant women. One drink equals 12 ounces of beer, 5 ounces of wine, or 1 ounces of hard liquor.  Do not use street drugs.  Do not share needles.  Ask your health care provider for help if  you need support or information about quitting drugs.  Tell your health care provider if you often feel depressed.  Tell your health care provider if you have ever been abused or do not feel safe at home.   This information is not intended to replace advice given to you by your health care provider. Make sure you discuss any questions you have with your health care provider.   Document Released: 01/03/2011 Document Revised: 07/11/2014 Document Reviewed: 05/22/2013 Elsevier Interactive Patient Education 2016 [employer].  We will set up referral for gastroenterologist and gynecologist.  Gynecology will need to prescribe any hormonal Therapy Psychiatry will need prescribe any further depression/anxiety medications.

## 2015-04-25 ENCOUNTER — Emergency Department (HOSPITAL_COMMUNITY)
Admission: EM | Admit: 2015-04-25 | Discharge: 2015-04-25 | Disposition: A | Discharge status: Home / Self Care | Attending: Emergency Medicine | Admitting: Emergency Medicine

## 2015-04-25 ENCOUNTER — Encounter (HOSPITAL_COMMUNITY): Payer: Self-pay | Admitting: *Deleted

## 2015-04-25 DIAGNOSIS — R197 Diarrhea, unspecified: Secondary | ICD-10-CM | POA: Insufficient documentation

## 2015-04-25 DIAGNOSIS — Z8742 Personal history of other diseases of the female genital tract: Secondary | ICD-10-CM | POA: Diagnosis not present

## 2015-04-25 DIAGNOSIS — F1210 Cannabis abuse, uncomplicated: Secondary | ICD-10-CM | POA: Diagnosis not present

## 2015-04-25 DIAGNOSIS — K644 Residual hemorrhoidal skin tags: Secondary | ICD-10-CM | POA: Diagnosis not present

## 2015-04-25 DIAGNOSIS — Z720 Tobacco use: Secondary | ICD-10-CM | POA: Insufficient documentation

## 2015-04-25 DIAGNOSIS — Z79899 Other long term (current) drug therapy: Secondary | ICD-10-CM | POA: Diagnosis not present

## 2015-04-25 DIAGNOSIS — F419 Anxiety disorder, unspecified: Secondary | ICD-10-CM | POA: Insufficient documentation

## 2015-04-25 DIAGNOSIS — R110 Nausea: Secondary | ICD-10-CM | POA: Insufficient documentation

## 2015-04-25 DIAGNOSIS — Z8619 Personal history of other infectious and parasitic diseases: Secondary | ICD-10-CM | POA: Insufficient documentation

## 2015-04-25 DIAGNOSIS — R109 Unspecified abdominal pain: Secondary | ICD-10-CM | POA: Diagnosis not present

## 2015-04-25 DIAGNOSIS — F32A Depression, unspecified: Secondary | ICD-10-CM

## 2015-04-25 DIAGNOSIS — R195 Other fecal abnormalities: Secondary | ICD-10-CM | POA: Diagnosis not present

## 2015-04-25 DIAGNOSIS — Z008 Encounter for other general examination: Secondary | ICD-10-CM | POA: Diagnosis present

## 2015-04-25 DIAGNOSIS — F329 Major depressive disorder, single episode, unspecified: Secondary | ICD-10-CM | POA: Diagnosis not present

## 2015-04-25 LAB — URINALYSIS, ROUTINE W REFLEX MICROSCOPIC
BILIRUBIN URINE: NEGATIVE
GLUCOSE, UA: NEGATIVE mg/dL
KETONES UR: NEGATIVE mg/dL
Leukocytes, UA: NEGATIVE
NITRITE: NEGATIVE
Protein, ur: NEGATIVE mg/dL
Urobilinogen, UA: 0.2 mg/dL (ref 0.0–1.0)
pH: 6.5 (ref 5.0–8.0)

## 2015-04-25 LAB — ETHANOL: Alcohol, Ethyl (B): <5 mg/dL (ref <5)

## 2015-04-25 LAB — BASIC METABOLIC PANEL
Anion gap: 9 (ref 5–15)
BUN: 10 mg/dL (ref 6–20)
CO2: 28 mmol/L (ref 22–32)
CREATININE: 1.02 mg/dL — AB (ref 0.44–1.00)
Calcium: 9.6 mg/dL (ref 8.9–10.3)
Chloride: 105 mmol/L (ref 101–111)
GFR calc Af Amer: >60 mL/min (ref >60)
GFR calc non Af Amer: >60 mL/min (ref >60)
GLUCOSE: 98 mg/dL (ref 65–99)
Potassium: 4.2 mmol/L (ref 3.5–5.1)
Sodium: 142 mmol/L (ref 135–145)

## 2015-04-25 LAB — RAPID URINE DRUG SCREEN, HOSP PERFORMED
Amphetamines: NOT DETECTED
BENZODIAZEPINES: NOT DETECTED
Barbiturates: NOT DETECTED
COCAINE: NOT DETECTED
Opiates: NOT DETECTED
TETRAHYDROCANNABINOL: POSITIVE — AB

## 2015-04-25 LAB — URINE MICROSCOPIC-ADD ON

## 2015-04-25 LAB — CBC WITH DIFFERENTIAL/PLATELET
Basophils Absolute: 0 10*3/uL (ref 0.0–0.1)
Basophils Relative: 0 %
Eosinophils Absolute: 0.2 10*3/uL (ref 0.0–0.7)
Eosinophils Relative: 3 %
HEMATOCRIT: 39.4 % (ref 36.0–46.0)
Hemoglobin: 13.5 g/dL (ref 12.0–15.0)
LYMPHS PCT: 26 %
Lymphs Abs: 2.2 10*3/uL (ref 0.7–4.0)
MCH: 30.3 pg (ref 26.0–34.0)
MCHC: 34.3 g/dL (ref 30.0–36.0)
MCV: 88.3 fL (ref 78.0–100.0)
MONO ABS: 0.6 10*3/uL (ref 0.1–1.0)
MONOS PCT: 7 %
NEUTROS ABS: 5.3 10*3/uL (ref 1.7–7.7)
Neutrophils Relative %: 64 %
Platelets: 277 10*3/uL (ref 150–400)
RBC: 4.46 MIL/uL (ref 3.87–5.11)
RDW: 12.4 % (ref 11.5–15.5)
WBC: 8.3 10*3/uL (ref 4.0–10.5)

## 2015-04-25 LAB — POC OCCULT BLOOD, ED: Fecal Occult Bld: NEGATIVE

## 2015-04-25 MED ORDER — OXYMETAZOLINE HCL 0.05 % NA SOLN
NASAL | Status: AC
  Filled 2015-04-25: qty 15

## 2015-04-25 MED ORDER — ESCITALOPRAM OXALATE 20 MG PO TABS: 30.0000 mg | ORAL_TABLET | Freq: Every day | ORAL | Status: DC

## 2015-04-25 MED ORDER — OXYMETAZOLINE HCL 0.05 % NA SOLN
2.0000 | Freq: Two times a day (BID) | NASAL | Status: DC | PRN
  Administered 2015-04-25: 2 via NASAL

## 2015-04-25 MED ORDER — ONDANSETRON HCL 4 MG PO TABS
4.0000 mg | ORAL_TABLET | Freq: Three times a day (TID) | ORAL | Status: DC | PRN
  Administered 2015-04-25: 4 mg via ORAL
  Filled 2015-04-25: qty 1

## 2015-04-25 MED ORDER — OXYMETAZOLINE HCL 0.05 % NA SOLN: 1.0000 | Freq: Once | NASAL | Status: DC

## 2015-04-25 MED ORDER — BUPROPION HCL ER (XL) 300 MG PO TB24: 300.0000 mg | ORAL_TABLET | Freq: Every day | ORAL | Status: DC

## 2015-04-25 MED ORDER — ALUM & MAG HYDROXIDE-SIMETH 200-200-20 MG/5ML PO SUSP
30.0000 mL | ORAL | Status: DC | PRN
Start: 2015-04-25 — End: 2015-04-26

## 2015-04-25 MED ORDER — ACETAMINOPHEN 325 MG PO TABS
650.0000 mg | ORAL_TABLET | ORAL | Status: DC | PRN
  Administered 2015-04-25: 650 mg via ORAL
  Filled 2015-04-25: qty 2

## 2015-04-25 NOTE — ED Notes (Signed)
States she is depressed for the past week, states she needs a medication change and also has rectal bleeding, states she is having thoughts of suicide but does not have a plan

## 2015-04-25 NOTE — ED Notes (Signed)
Patient's home meds placed in pyxis

## 2015-04-25 NOTE — ED Provider Notes (Signed)
CSN: [id number]     Arrival date & time 04/25/15  1824 History   First MD Initiated Contact with Patient 04/25/15 1924     Chief Complaint  Patient presents with  . V70.1     (Consider location/radiation/quality/duration/timing/severity/associated sxs/prior Treatment) HPI Comments: Patient from home with depression worsening over the past one week. She takes Wellbutrin and Lexapro for depression and feels that it is not working. She endorses intermittent thoughts of overdosing or running into traffic but denies any plan at this time. No homicidal thoughts or hallucinations. She endorses marijuana use but no other drug use. She denies any significant alcohol use. Patient reports ongoing issues with abdominal pain for the past 2 years with intermittent rectal bleeding. She saw her PCP yesterday for these symptoms and was referred to GI. She was told she had colitis in the past. History of cholecystectomy last year. She's never had a colonoscopy. She endorses nausea. She states she has bloody bowel movements intermittently last episode one week ago. Denies any blood today. She denies any dizziness or lightheadedness. No chest pain or shortness of breath.  The history is provided by the patient and a relative.    Past Medical History  Diagnosis Date  . Anxiety   . Depression   . Endometriosis   . Tobacco use   . Colitis   . Cholelithiasis   . History of chicken pox   . Eating disorder   . GERD (gastroesophageal reflux disease)   . Seasonal allergies   . Blood in stool    Past Surgical History  Procedure Laterality Date  . Abdominal hysterectomy  10/23/08  . Cesarean section    . Abdominoplasty    . Bartholin gland cyst excision    . Cholecystectomy N/A 01/27/2014    Procedure: LAPAROSCOPIC CHOLECYSTECTOMY;  Surgeon: [name], MD;  Location: [facility];  Service: General;  Laterality: N/A;   Family History  Problem Relation Age of Onset  . Osteoporosis Mother   . Arthritis Mother      OA  . Stroke Mother   . Hypertension Mother   . Heart disease Father   . Osteoporosis Father   . Diabetes Brother   . Colon cancer Maternal Grandmother    Social History  Substance Use Topics  . Smoking status: Current Every Day Smoker -- 0.25 packs/day    Types: Cigarettes  . Smokeless tobacco: None  . Alcohol Use: Yes     Comment: beer occ   OB History    Gravida Para Term Preterm AB TAB SAB Ectopic Multiple Living   1 1       1 2       Obstetric Comments   Has 3 stepchildren     Review of Systems  Constitutional: Positive for activity change and appetite change. Negative for fever.  HENT: Negative for congestion.   Eyes: Negative for visual disturbance.  Respiratory: Negative for cough, chest tightness and shortness of breath.   Cardiovascular: Negative for chest pain.  Gastrointestinal: Positive for nausea, abdominal pain, diarrhea and blood in stool. Negative for vomiting.  Genitourinary: Negative for dysuria, hematuria, vaginal bleeding and vaginal discharge.  Musculoskeletal: Negative for myalgias and arthralgias.  Skin: Negative for rash.  Psychiatric/Behavioral: Positive for suicidal ideas, sleep disturbance, self-injury and dysphoric mood. The patient is nervous/anxious.   A complete 10 system review of systems was obtained and all systems are negative except as noted in the HPI and PMH.      Allergies  Review of patient's allergies indicates no known allergies.  Home Medications   Prior to Admission medications   Medication Sig Start Date End Date Taking? Authorizing Provider  buPROPion (WELLBUTRIN XL) 300 MG 24 hr tablet Take 1 tablet (300 mg total) by mouth daily. 04/24/15  Yes Renee A Kuneff, DO  clonazePAM (KLONOPIN) 1 MG tablet Take 1 tablet (1 mg total) by mouth 2 (two) times daily as needed for anxiety. 04/24/15  Yes Renee A Kuneff, DO  diphenhydrAMINE (BENADRYL) 25 MG tablet Take 75 mg by mouth every 6 (six) hours as needed for sleep.   Yes  Historical Provider, MD  escitalopram (LEXAPRO) 10 MG tablet Take 3 tablets (30 mg total) by mouth at bedtime. 04/24/15  Yes Renee A Kuneff, DO  Estradiol-Estriol-Progesterone (BIEST/PROGESTERONE TD) Place onto the skin 2 (two) times daily. 4 clicks applied to wither forearm daily (Biest 0.86md (8/2)/ Progest 20/90ml   Yes Historical Provider, MD  NON FORMULARY Take 2 tablets by mouth daily. Corticare-B   Yes Historical Provider, MD  oxymetazoline (AFRIN) 0.05 % nasal spray Place 1 spray into both nostrils at bedtime.   Yes Historical Provider, MD  TESTOSTERONE TD Place 0.2 mg onto the skin every Monday, Wednesday, and Friday.    Yes Historical Provider, MD   BP 105/64 mmHg  Pulse 65  Temp(Src) 98.2 F (36.8 C) (Oral)  Resp 18  Ht 5\' 6"  (1.676 m)  Wt 158 lb (71.668 kg)  BMI 25.51 kg/m2  SpO2 99% Physical Exam  Constitutional: She is oriented to person, place, and time. She appears well-developed and well-nourished. No distress.  Anxious, pressured speech  HENT:  Head: Normocephalic and atraumatic.  Mouth/Throat: Oropharynx is clear and moist. No oropharyngeal exudate.  Eyes: Conjunctivae and EOM are normal. Pupils are equal, round, and reactive to light.  Neck: Normal range of motion. Neck supple.  No meningismus.  Cardiovascular: Normal rate, regular rhythm, normal heart sounds and intact distal pulses.   No murmur heard. Pulmonary/Chest: Effort normal and breath sounds normal. No respiratory distress.  Abdominal: Soft. There is no tenderness. There is no rebound and no guarding.  Genitourinary: Guaiac negative stool.  Chaperone present. Small external hemorrhoids. No gross blood.  No fissures  Musculoskeletal: Normal range of motion. She exhibits no edema or tenderness.  Neurological: She is alert and oriented to person, place, and time. No cranial nerve deficit. She exhibits normal muscle tone. Coordination normal.  No ataxia on finger to nose bilaterally. No pronator drift. 5/5  strength throughout. CN 2-12 intact. Negative Romberg. Equal grip strength. Sensation intact. Gait is normal.   Skin: Skin is warm.  Psychiatric: She has a normal mood and affect. Her behavior is normal.  Nursing note and vitals reviewed.   ED Course  Procedures (including critical care time) Labs Review Labs Reviewed  URINALYSIS, ROUTINE W REFLEX MICROSCOPIC (NOT AT [facility]) - Abnormal; Notable for the following:    Specific Gravity, Urine <1.005 (*)    Hgb urine dipstick TRACE (*)    All other components within normal limits  URINE RAPID DRUG SCREEN, HOSP PERFORMED - Abnormal; Notable for the following:    Tetrahydrocannabinol POSITIVE (*)    All other components within normal limits  BASIC METABOLIC PANEL - Abnormal; Notable for the following:    Creatinine, Ser 1.02 (*)    All other components within normal limits  CBC WITH DIFFERENTIAL/PLATELET  ETHANOL  URINE MICROSCOPIC-ADD ON  POC OCCULT BLOOD, ED    Imaging Review No results found.  I have personally reviewed and evaluated these images and lab results as part of my medical decision-making.   EKG Interpretation None      MDM   Final diagnoses:  Depression   Depression with intermittent suicidal thoughts.  Patient has had ongoing abdominal pain with intermittent rectal bleeding for the past 2 years. She saw her PCP yesterday for this and was referred to gastroenterology. Her abdomen is benign today. Her labs are unremarkable. Hemoccult is negative today.  She is medically clear for psychiatric evaluation.  Patient has been accepted to behavioral health Hospital by Dr. Parke Poisson.  BP 105/64 mmHg  Pulse 65  Temp(Src) 98.2 F (36.8 C) (Oral)  Resp 18  Ht 5\' 6"  (1.676 m)  Wt 158 lb (71.668 kg)  BMI 25.51 kg/m2  SpO2 99%     Ezequiel Essex, MD 04/25/15 [phone]
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
## 2015-04-26 ENCOUNTER — Encounter (HOSPITAL_COMMUNITY): Payer: Self-pay

## 2015-04-26 ENCOUNTER — Inpatient Hospital Stay (HOSPITAL_COMMUNITY)
Admission: AD | Admit: 2015-04-26 | Discharge: 2015-04-29 | DRG: 885 | Disposition: A | Discharge status: Home / Self Care | Source: Intra-hospital | Attending: Psychiatry | Admitting: Psychiatry

## 2015-04-26 DIAGNOSIS — F419 Anxiety disorder, unspecified: Secondary | ICD-10-CM | POA: Diagnosis present

## 2015-04-26 DIAGNOSIS — F319 Bipolar disorder, unspecified: Secondary | ICD-10-CM

## 2015-04-26 DIAGNOSIS — Z833 Family history of diabetes mellitus: Secondary | ICD-10-CM | POA: Diagnosis not present

## 2015-04-26 DIAGNOSIS — K219 Gastro-esophageal reflux disease without esophagitis: Secondary | ICD-10-CM | POA: Diagnosis present

## 2015-04-26 DIAGNOSIS — Z8249 Family history of ischemic heart disease and other diseases of the circulatory system: Secondary | ICD-10-CM

## 2015-04-26 DIAGNOSIS — F17210 Nicotine dependence, cigarettes, uncomplicated: Secondary | ICD-10-CM | POA: Diagnosis present

## 2015-04-26 DIAGNOSIS — R45851 Suicidal ideations: Secondary | ICD-10-CM | POA: Diagnosis present

## 2015-04-26 DIAGNOSIS — F32A Depression, unspecified: Secondary | ICD-10-CM | POA: Diagnosis present

## 2015-04-26 DIAGNOSIS — F329 Major depressive disorder, single episode, unspecified: Secondary | ICD-10-CM | POA: Diagnosis present

## 2015-04-26 DIAGNOSIS — G4700 Insomnia, unspecified: Secondary | ICD-10-CM | POA: Diagnosis present

## 2015-04-26 DIAGNOSIS — F1210 Cannabis abuse, uncomplicated: Secondary | ICD-10-CM | POA: Diagnosis present

## 2015-04-26 DIAGNOSIS — F3162 Bipolar disorder, current episode mixed, moderate: Secondary | ICD-10-CM | POA: Diagnosis not present

## 2015-04-26 MED ORDER — LOPERAMIDE HCL 2 MG PO CAPS
2.0000 mg | ORAL_CAPSULE | ORAL | Status: DC | PRN
  Administered 2015-04-26 – 2015-04-27 (×2): 2 mg via ORAL
  Filled 2015-04-26 (×2): qty 1

## 2015-04-26 MED ORDER — LAMOTRIGINE 25 MG PO TABS
25.0000 mg | ORAL_TABLET | Freq: Every day | ORAL | Status: DC
  Administered 2015-04-26 – 2015-04-29 (×4): 25 mg via ORAL
  Filled 2015-04-26 (×7): qty 1

## 2015-04-26 MED ORDER — ONDANSETRON 4 MG PO TBDP
4.0000 mg | ORAL_TABLET | Freq: Three times a day (TID) | ORAL | Status: DC | PRN
  Administered 2015-04-26 – 2015-04-28 (×2): 4 mg via ORAL
  Filled 2015-04-26: qty 1

## 2015-04-26 MED ORDER — QUETIAPINE FUMARATE 25 MG PO TABS
25.0000 mg | ORAL_TABLET | Freq: Two times a day (BID) | ORAL | Status: DC | PRN
  Administered 2015-04-26 – 2015-04-29 (×5): 25 mg via ORAL
  Filled 2015-04-26 (×5): qty 1

## 2015-04-26 MED ORDER — LOPERAMIDE HCL 2 MG PO CAPS
4.0000 mg | ORAL_CAPSULE | Freq: Once | ORAL | Status: AC
  Administered 2015-04-26: 4 mg via ORAL
  Filled 2015-04-26: qty 2

## 2015-04-26 MED ORDER — ONDANSETRON 4 MG PO TBDP
ORAL_TABLET | ORAL | Status: AC
  Administered 2015-04-26: 10:00:00
  Filled 2015-04-26: qty 1

## 2015-04-26 MED ORDER — LOPERAMIDE HCL 2 MG PO CAPS
ORAL_CAPSULE | ORAL | Status: AC
  Filled 2015-04-26: qty 2

## 2015-04-26 MED ORDER — BUPROPION HCL ER (XL) 300 MG PO TB24
300.0000 mg | ORAL_TABLET | Freq: Every day | ORAL | Status: DC
  Administered 2015-04-26 – 2015-04-29 (×4): 300 mg via ORAL
  Filled 2015-04-26 (×8): qty 1

## 2015-04-26 MED ORDER — ENSURE ENLIVE PO LIQD
237.0000 mL | Freq: Two times a day (BID) | ORAL | Status: DC
  Administered 2015-04-26 – 2015-04-28 (×3): 237 mL via ORAL

## 2015-04-26 MED ORDER — NICOTINE 7 MG/24HR TD PT24
7.0000 mg | MEDICATED_PATCH | Freq: Every day | TRANSDERMAL | Status: DC
  Administered 2015-04-26 – 2015-04-29 (×4): 7 mg via TRANSDERMAL
  Filled 2015-04-26 (×7): qty 1

## 2015-04-26 MED ORDER — LOPERAMIDE HCL 2 MG PO CAPS
4.0000 mg | ORAL_CAPSULE | ORAL | Status: DC | PRN
  Administered 2015-04-26: 4 mg via ORAL

## 2015-04-26 MED ORDER — QUETIAPINE FUMARATE 50 MG PO TABS
50.0000 mg | ORAL_TABLET | Freq: Every day | ORAL | Status: DC
  Administered 2015-04-26: 50 mg via ORAL
  Filled 2015-04-26 (×3): qty 1

## 2015-04-26 MED ORDER — HYDROXYZINE HCL 50 MG PO TABS
50.0000 mg | ORAL_TABLET | Freq: Four times a day (QID) | ORAL | Status: DC | PRN
  Administered 2015-04-26: 50 mg via ORAL
  Filled 2015-04-26: qty 1

## 2015-04-26 MED ORDER — OXYMETAZOLINE HCL 0.05 % NA SOLN
1.0000 | Freq: Two times a day (BID) | NASAL | Status: DC
  Administered 2015-04-26 – 2015-04-27 (×2): 1 via NASAL
  Filled 2015-04-26 (×2): qty 15

## 2015-04-26 NOTE — Progress Notes (Signed)
Admission Note:  D:44 yr female who presents [facility] in no acute distress for the treatment of SI and Depression. Pt appears hyper verbal and a little disorganized, with flight of ideas at times . Pt was calm and cooperative with admission process. Pt presents with passive SI and contracts for safety upon admission. Pt denies AVH . Pt believes her Lexapro is not working for her anymore. "I feel worthless, no good, I hate this world. Pt stated due to her family stress from her kids and her husband that has done 49 tours (TBI, PTSD) , pt got overwhelmed .   A:Skin was assessed by female nurse . PT searched and no contraband found, POC and unit policies explained and understanding verbalized.   R:Consents obtained. Food and fluids offered, and fluids accepted. Pt had no additional questions or concerns.

## 2015-04-26 NOTE — [facility] Group Notes (Signed)
[place] Group Notes:  Healthy coping systems  Date:  04/26/2015  Time:  10:39 AM  Type of Therapy:  Nurse Education  Participation Level:  Did Not Attend  Participation Quality:  Inattentive  Affect:  Depressed  Cognitive:  Lacking  Insight:  None  Engagement in Group:  None  Modes of Intervention:  Discussion  Summary of Progress/Problems:Pt did not attend  Marcello Moores [facility] 04/26/2015, 10:39 AM
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
## 2015-04-26 NOTE — Progress Notes (Addendum)
Pt is very hyper at times. She does appear anxious. Pt had a small amount of diarrhea and stated,"It just comes out and I can not control it." NP made aware and pt was given 2 immodium and 4mg  of zofran for nausea. Pt has been on the phone with her mom requesting a change of clothes. Pt does contract for safety and denies SI and HI. 12noon-pt stated she feels much better now after taking zofran and immodium. Pt requested more pads and another imodium at 2:50pm along with a seroquel 25mg  for her nerves. Pt has been in the dayroom with the other pts and at times can be loud and overbearing. Instructed the pt to stay away from fried ,greasy food and to try to eat bland food and drink gatorade.

## 2015-04-26 NOTE — Tx Team (Signed)
Initial Interdisciplinary Treatment Plan   PATIENT STRESSORS: Marital or family conflict Medication change or noncompliance   PATIENT STRENGTHS: Ability for insight Average or above average intelligence General fund of knowledge Motivation for treatment/growth Supportive family/friends   PROBLEM LIST: Problem List/Patient Goals Date to be addressed Date deferred Reason deferred Estimated date of resolution  Risk for suicide 04/26/15     depression 04/26/15     Family crisis 04/26/15     "focus on me and getting myself well" 04/26/15                                    DISCHARGE CRITERIA:  Improved stabilization in mood, thinking, and/or behavior Verbal commitment to aftercare and medication compliance  PRELIMINARY DISCHARGE PLAN: Attend aftercare/continuing care group Outpatient therapy  PATIENT/FAMIILY INVOLVEMENT: This treatment plan has been presented to and reviewed with the patient, Samantha Lyons.  The patient and family have been given the opportunity to ask questions and make suggestions.  Vonzella Nipple A 04/26/2015, 5:39 AM

## 2015-04-26 NOTE — BHH Suicide Risk Assessment (Signed)
[facility] Admission Suicide Risk Assessment   Nursing information obtained from:    Demographic factors:    Current Mental Status:    Loss Factors:    Historical Factors:    Risk Reduction Factors:    Total Time spent with patient: 45 minutes Principal Problem: Bipolar disorder ([place]) Diagnosis:   Patient Active Problem List   Diagnosis Date Noted  . Bipolar disorder ([place]) [F31.9] 04/26/2015  . Encounter to establish care [Z71.89] 04/24/2015  . Abdominal pain [R10.9] 04/24/2015  . Fatigue [R53.83] 04/24/2015  . Diarrhea [R19.7] 04/24/2015  . Fecal incontinence [R15.9] 04/24/2015  . Healthcare maintenance [Z00.00] 04/24/2015  . Anxiety [F41.9]   . Depression [F32.9]   . Endometriosis [N80.9]   . Tobacco use [Z72.0]      Continued Clinical Symptoms:  Alcohol Use Disorder Identification Test Final Score (AUDIT): 1 The "Alcohol Use Disorders Identification Test", Guidelines for Use in Primary Care, Second Edition.  World [employer]). Score between 0-7:  no or low risk or alcohol related problems. Score between 8-15:  moderate risk of alcohol related problems. Score between 16-19:  high risk of alcohol related problems. Score 20 or above:  warrants further diagnostic evaluation for alcohol dependence and treatment.   CLINICAL FACTORS:   Bipolar Disorder:   Mixed State Depression:   Anhedonia Hopelessness Impulsivity Insomnia Recent sense of peace/wellbeing Severe Alcohol/Substance Abuse/Dependencies More than one psychiatric diagnosis Unstable or Poor Therapeutic Relationship Previous Psychiatric Diagnoses and Treatments   Musculoskeletal: Strength & Muscle Tone: within normal limits Gait & Station: normal Patient leans: N/A  Psychiatric Specialty Exam: Physical Exam  ROS  Blood pressure 86/50, pulse 65, temperature 97.7 F (36.5 C), temperature source Oral, resp. rate 16, height 5\' 6"  (1.676 m), weight 70.761 kg (156 lb).Body mass index is 25.19  kg/(m^2).  see mental status examination in history and physical      COGNITIVE FEATURES THAT CONTRIBUTE TO RISK:  Polarized thinking and Thought constriction (tunnel vision)    SUICIDE RISK:   Minimal: No identifiable suicidal ideation.  Patients presenting with no risk factors but with morbid ruminations; may be classified as minimal risk based on the severity of the depressive symptoms  PLAN OF CARE: Patient is 44 year old married unemployed female who is admitted due to worsening of symptoms.  She is having suicidal thoughts.  Please see history and physical for more details.  Medical Decision Making:  New problem, with additional work up planned, Review of Psycho-Social Stressors (1), Review or order clinical lab tests (1), Review and summation of old records (2), Established Problem, Worsening (2), Review of Medication Regimen & Side Effects (2) and Review of New Medication or Change in Dosage (2)  I certify that inpatient services furnished can reasonably be expected to improve the patient's condition.   Samantha Lyons T. 04/26/2015, 10:55 AM

## 2015-04-26 NOTE — Progress Notes (Signed)
The patient is awake and is stating that she cannot go to sleep at this time. She stated to this author that laying down in her room brings about a number of bad thoughts.
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
## 2015-04-26 NOTE — Progress Notes (Signed)
Nutrition Brief Note  Patient identified on the Malnutrition Screening Tool (MST) Report  Wt Readings from Last 15 Encounters:  04/26/15 156 lb (70.761 kg)  04/25/15 158 lb (71.668 kg)  04/24/15 158 lb 12 oz (72.009 kg)  01/27/14 169 lb (76.658 kg)  01/22/14 170 lb (77.111 kg)  12/07/11 173 lb (78.472 kg)  10/04/11 174 lb (78.926 kg)    Body mass index is 25.19 kg/(m^2). Patient meets criteria for overweight based on current BMI.   Diet Order:   Pt is also offered choice of unit snacks mid-morning and mid-afternoon.    Labs and medications reviewed.   No nutrition interventions warranted at this time. If nutrition issues arise, please consult RD.   Clayton Bibles, MS, RD, LDN Pager: [phone] After Hours Pager: [phone]

## 2015-04-26 NOTE — BHH Group Notes (Signed)
[facility] Group Notes:  (Clinical Social Work)  04/26/2015  1:15-2:15PM  Summary of Progress/Problems: The main focus of today's process group was to  1) discuss the importance of adding supports 2) define health supports versus unhealthy supports 3) List possible positive and healthy supports that can be added for various purposes 4) Identify what healthy supports patient wants to add to her life  An emphasis was placed on using counselor, doctor, therapy groups, 12-step groups, and problem-specific support groups to expand supports.   The patient expressed full comprehension of the concepts presented, and agreed that there is a need to add more supports. The patient stated she needs medication, a doctor, exercise, and to stay with her church-based mentor who helps her to keep on track.  She also said she has to be a better support for herself.  She stated that her therapist has told her she needs to make adult friends for her life, because she does so many things with her children and with their friends that she has 20 children at her house every weekend.  The patient was hypomanic throughout group.  Type of Therapy:  Process Group with Motivational Interviewing  Participation Level:  Active  Participation Quality:  Intrusive, Monopolizing and Redirectable  Affect:  Excited  Cognitive:  Alert  Insight:  Improving  Engagement in Therapy:  Engaged  Modes of Intervention:   Education, Support and Processing, Activity  Selmer Dominion, LCSW 04/26/2015

## 2015-04-27 MED ORDER — QUETIAPINE FUMARATE 100 MG PO TABS
100.0000 mg | ORAL_TABLET | Freq: Every day | ORAL | Status: DC
  Administered 2015-04-27: 100 mg via ORAL
  Filled 2015-04-27 (×3): qty 1

## 2015-04-27 MED ORDER — NONFORMULARY OR COMPOUNDED ITEM
1.0000 mL | Status: DC
  Administered 2015-04-27: 1 mL via VAGINAL

## 2015-04-27 MED ORDER — NONFORMULARY OR COMPOUNDED ITEM
1.0000 mL | Freq: Two times a day (BID) | Status: DC
  Administered 2015-04-27: 1 mL via TOPICAL

## 2015-04-27 MED ORDER — OXYMETAZOLINE HCL 0.05 % NA SOLN
1.0000 | Freq: Every day | NASAL | Status: DC
Start: 2015-04-28 — End: 2015-04-29
  Administered 2015-04-28: 1 via NASAL
  Filled 2015-04-27: qty 15

## 2015-04-27 MED ORDER — NONFORMULARY OR COMPOUNDED ITEM
2.0000 [IU] | Freq: Two times a day (BID) | Status: DC
  Administered 2015-04-27 – 2015-04-29 (×4): 2 [IU] via TOPICAL
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
## 2015-04-27 NOTE — Telephone Encounter (Signed)
Left message for patient to return call for lab results. 

## 2015-04-27 NOTE — B[facility] Group Notes (Signed)
Adult Psychoeducational Group Note  Date:  04/27/2015 Time:  9:00 PM  Group Topic/Focus:  Wrap-Up Group:   The focus of this group is to help patients review their daily goal of treatment and discuss progress on daily workbooks.  Participation Level:  Active  Participation Quality:  Appropriate and Attentive  Affect:  Appropriate  Cognitive:  Alert and Appropriate  Insight: Appropriate  Engagement in Group:  Engaged  Modes of Intervention:  Discussion and Education  Additional Comments:  Pt stated her day was great, medication working not feeling anxious or nervous.   Marlowe Shores D 04/27/2015, 9:00 PM

## 2015-04-27 NOTE — Progress Notes (Signed)
D: Pt denies SI/HI/AVH. Pt is pleasant and cooperative. Pt continues to be hyperverbal, intrusive, but redirectable.   A: Pt was offered support and encouragement. Pt was given scheduled medications. Pt was encourage to attend groups. Q 15 minute checks were done for safety.    R:Pt attends groups and interacts well with peers and staff. Pt is taking medication. Pt has no complaints.Pt receptive to treatment and safety maintained on unit.

## 2015-04-27 NOTE — Plan of Care (Signed)
Problem: Consults Goal: Anxiety Disorder Patient Education See Patient Education Module for eduction specifics.  Outcome: Progressing Nurse discussed anxiety/coping skills with patient.        

## 2015-04-27 NOTE — Plan of Care (Signed)
Problem: Alteration in mood; excessive anxiety as evidenced by: Goal: LTG-Patient's behavior demonstrates decreased anxiety (Patient's behavior demonstrates anxiety and he/she is utilizing learned coping skills to deal with anxiety-producing situations)  Outcome: Not Progressing Pt continues to be hyperverbal and intrusive, pt stated she was very anxious today

## 2015-04-27 NOTE — [facility] Group Notes (Signed)
[facility] LCSW Group Therapy  04/27/2015 1:15pm  Type of Therapy:  Group Therapy vercoming Obstacles  Participation Level:  Active  Participation Quality:  Hyperverbal  Affect:  Appropriate  Cognitive:  Appropriate and Oriented  Insight:  Developing/Improving and Improving  Engagement in Therapy:  Improving  Modes of Intervention:  Discussion, Exploration, Problem-solving and Support  Description of Group:   In this group patients will be encouraged to explore what they see as obstacles to their own wellness and recovery. They will be guided to discuss their thoughts, feelings, and behaviors related to these obstacles. The group will process together ways to cope with barriers, with attention given to specific choices patients can make. Each patient will be challenged to identify changes they are motivated to make in order to overcome their obstacles. This group will be process-oriented, with patients participating in exploration of their own experiences as well as giving and receiving support and challenge from other group members.  Summary of Patient Progress: Pt continues to be hyper verbal in group; has circumstantial speech at times but typically finishes the idea she started with. Pt expressed that she feels that her obstacle is "letting her kids go" in that she cannot control every decision that they make. Pt focused on being positive; she is supportive of other group members and is able to relate to their comments.   Therapeutic Modalities:   Cognitive Behavioral Therapy Solution Focused Therapy Motivational Interviewing Relapse Prevention Therapy   Peri Maris, LCSWA 04/27/2015 4:10 PM

## 2015-04-27 NOTE — Progress Notes (Signed)
Recreation Therapy Notes  Date: 10.24.2016 Time: 9:30am Location: [address] Hall Group Room   Group Topic: Stress Management  Goal Area(s) Addresses:  Patient will actively participate in stress management techniques presented during session.   Behavioral Response: Appropriate   Intervention: Stress management techniques  Activity :  Deep Breathing and Progressive Muscle Relaxation. LRT provided instruction and demonstration on practice of Progressive Muscle Relaxation. Technique was coupled with deep breathing.   Education:  Stress Management, Discharge Planning.   Education Outcome: Acknowledges education  Clinical Observations/Feedback: Patient actively engaged in technique introduced, expressed no concerns and demonstrated ability to practice independently post d/c.   Laureen Ochs Yoona Ishii, LRT/CTRS  Abrham Maslowski L 04/27/2015 10:04 AM

## 2015-04-27 NOTE — Progress Notes (Signed)
[facility] Group Notes:  (Nursing/MHT/Case Management/Adjunct)  Date:  04/27/2015  Time:  12:23 AM  Type of Therapy:  Psychoeducational Skills  Participation Level:  Active  Participation Quality:  Appropriate  Affect:  Appropriate  Cognitive:  Appropriate  Insight:  Improving  Engagement in Group:  Improving  Modes of Intervention:  Education  Summary of Progress/Problems: The patient stated that she had a good day overall except for a bad family visit. She states that going outside for fresh air and watching television with her peers was a positive experience for her. Apparently, her visit with her husband did not go well. As for the theme of the day, her support system will be comprised of her doctor and therapist.   Gennette Pac 04/27/2015, 12:23 AM

## 2015-04-27 NOTE — Progress Notes (Signed)
D:  Patient denied SI and HI, contracts for safety.  Denied A/V hallucinations.   A:  Medications administered per MD orders.  Emotional support and encouragement given patient. R:  Safety maintained with 15 minute checks.  

## 2015-04-27 NOTE — Progress Notes (Signed)
[facility], [place] MD Progress Note  04/27/2015 5:32 PM Samantha Lyons  MRN:  [id number] Subjective:   Patient states she is feeling better. Objective : I have discussed case with treatment team and have met with patient. Patient is a 44 year old married female, who presented to the emergency room due to suicidal ideations , depression,and vague irritability, mood instability. She was being prescribed  Lexapro 40 mg QDAY and Wellbutrin 300 mg QDAY for mood disorder . She reports significant psychosocial stressors, to include being married to a disabled veteran, and having 5 children, several teenagers . At this time patient reports improvement compared to her presentation prior to admission. She states she is feeling better, and that her mood is more stable. At present denies medication side effects . As per staff report, notes, yesterday presented with some tendency toward irritability, being hyper-verbal, somewhat intrusive . Today she is improved, and at this time does not present with pressured speech, and does not currently present irritable or intrusive. She has been visible on the unit, going to groups. She states she slept better last night than she has in  " a long while".  . Principal Problem: Bipolar disorder ([place]) Diagnosis:   Patient Active Problem List   Diagnosis Date Noted  . Bipolar disorder ([place]) [F31.9] 04/26/2015  . Encounter to establish care [Z71.89] 04/24/2015  . Abdominal pain [R10.9] 04/24/2015  . Fatigue [R53.83] 04/24/2015  . Diarrhea [R19.7] 04/24/2015  . Fecal incontinence [R15.9] 04/24/2015  . Healthcare maintenance [Z00.00] 04/24/2015  . Anxiety [F41.9]   . Depression [F32.9]   . Endometriosis [N80.9]   . Tobacco use [Z72.0]    Total Time spent with patient: 25 minutes     Past Medical History:  Past Medical History  Diagnosis Date  . Anxiety   . Depression   . Endometriosis   . Tobacco use   . Colitis   . Cholelithiasis   . History of chicken pox   .  Eating disorder   . GERD (gastroesophageal reflux disease)   . Seasonal allergies   . Blood in stool     Past Surgical History  Procedure Laterality Date  . Abdominal hysterectomy  10/23/08  . Cesarean section    . Abdominoplasty    . Bartholin gland cyst excision    . Cholecystectomy N/A 01/27/2014    Procedure: LAPAROSCOPIC CHOLECYSTECTOMY;  Surgeon: [name], MD;  Location: [facility];  Service: General;  Laterality: N/A;   Family History:  Family History  Problem Relation Age of Onset  . Osteoporosis Mother   . Arthritis Mother     OA  . Stroke Mother   . Hypertension Mother   . Heart disease Father   . Osteoporosis Father   . Diabetes Brother   . Colon cancer Maternal Grandmother     Social History:  History  Alcohol Use No    Comment: beer occ     History  Drug Use  . Yes  . Special: Marijuana    Social History   Social History  . Marital Status: Married    Spouse Name: N/A  . Number of Children: N/A  . Years of Education: N/A   Social History Main Topics  . Smoking status: Current Every Day Smoker -- 0.25 packs/day for 20 years    Types: Cigarettes  . Smokeless tobacco: None  . Alcohol Use: No     Comment: beer occ  . Drug Use: Yes    Special: Marijuana  .  Sexual Activity: Yes    Birth Control/ Protection: Surgical, None   Other Topics Concern  . None   Social History Narrative   Married. High school grad.   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    Smoker. MJ use.  Herbal remedies, seat belts, wears a bike helmet. Exercises more than 3 times a week.    SMoke alarm in the home.    Guns in the home (in a locked cabinet)   Additional Social History:   Sleep:  Improved, states she slept much better last night   Appetite:  Good  Current Medications: Current Facility-Administered Medications  Medication Dose Route Frequency Provider Last Rate Last Dose  . Biest 0.35m(8/2)prog20/ml   2 Units Topical BID FJenne Campus MD    2 Units at 04/27/15 1642  . buPROPion (WELLBUTRIN XL) 24 hr tablet 300 mg  300 mg Oral Daily CDara Hoyer PA-C   300 mg at 04/27/15 06073 . feeding supplement (ENSURE ENLIVE) (ENSURE ENLIVE) liquid 237 mL  237 mL Oral BID BM FMyer PeerCobos, MD   237 mL at 04/27/15 1449  . lamoTRIgine (LAMICTAL) tablet 25 mg  25 mg Oral Daily SKathlee Nations MD   25 mg at 04/27/15 0811  . loperamide (IMODIUM) capsule 2 mg  2 mg Oral Q2H PRN JBenjamine Mola FNP   2 mg at 04/27/15 07106 . nicotine (NICODERM CQ - dosed in mg/24 hr) patch 7 mg  7 mg Transdermal Daily FJenne Campus MD   7 mg at 04/27/15 02694 . ondansetron (ZOFRAN-ODT) disintegrating tablet 4 mg  4 mg Oral Q8H PRN JBenjamine Mola FNP   4 mg at 04/26/15 08546 . [START ON 04/28/2015] oxymetazoline (AFRIN) 0.05 % nasal spray 1 spray  1 spray Each Nare QHS Urie Loughner A Theta Leaf, MD      . QUEtiapine (SEROQUEL) tablet 100 mg  100 mg Oral QHS Misty Foutz A Fouad Taul, MD      . QUEtiapine (SEROQUEL) tablet 25 mg  25 mg Oral BID PRN SKathlee Nations MD   25 mg at 04/27/15 0826  . Testosterone 0.254mml cr  1 mL Vaginal Q M,W,F-2000 FeJenne CampusMD        Lab Results:  Results for orders placed or performed during the hospital encounter of 04/25/15 (from the past 48 hour(s))  POC occult blood, ED Provider will collect     Status: None   Collection Time: 04/25/15  7:53 PM  Result Value Ref Range   Fecal Occult Bld NEGATIVE NEGATIVE  Urinalysis, Routine w reflex microscopic ([facility]    Status: Abnormal   Collection Time: 04/25/15  7:55 PM  Result Value Ref Range   Color, Urine YELLOW YELLOW   APPearance CLEAR CLEAR   Specific Gravity, Urine <1.005 (L) 1.005 - 1.030   pH 6.5 5.0 - 8.0   Glucose, UA NEGATIVE NEGATIVE mg/dL   Hgb urine dipstick TRACE (A) NEGATIVE   Bilirubin Urine NEGATIVE NEGATIVE   Ketones, ur NEGATIVE NEGATIVE mg/dL   Protein, ur NEGATIVE NEGATIVE mg/dL   Urobilinogen, UA 0.2 0.0 - 1.0 mg/dL   Nitrite NEGATIVE NEGATIVE    Leukocytes, UA NEGATIVE NEGATIVE  Urine rapid drug screen (hosp performed)     Status: Abnormal   Collection Time: 04/25/15  7:55 PM  Result Value Ref Range   Opiates NONE DETECTED NONE DETECTED   Cocaine NONE DETECTED NONE DETECTED   Benzodiazepines NONE  DETECTED NONE DETECTED   Amphetamines NONE DETECTED NONE DETECTED   Tetrahydrocannabinol POSITIVE (A) NONE DETECTED   Barbiturates NONE DETECTED NONE DETECTED    Comment:        DRUG SCREEN FOR MEDICAL PURPOSES ONLY.  IF CONFIRMATION IS NEEDED FOR ANY PURPOSE, NOTIFY LAB WITHIN 5 DAYS.        LOWEST DETECTABLE LIMITS FOR URINE DRUG SCREEN Drug Class       Cutoff (ng/mL) Amphetamine      1000 Barbiturate      200 Benzodiazepine   709 Tricyclics       628 Opiates          300 Cocaine          300 THC              50   Urine microscopic-add on     Status: None   Collection Time: 04/25/15  7:55 PM  Result Value Ref Range   Squamous Epithelial / LPF RARE RARE   WBC, UA 0-2 <3 WBC/hpf   RBC / HPF 0-2 <3 RBC/hpf   Bacteria, UA RARE RARE  CBC with Differential     Status: None   Collection Time: 04/25/15  8:24 PM  Result Value Ref Range   WBC 8.3 4.0 - 10.5 K/uL   RBC 4.46 3.87 - 5.11 MIL/uL   Hemoglobin 13.5 12.0 - 15.0 g/dL   HCT 39.4 36.0 - 46.0 %   MCV 88.3 78.0 - 100.0 fL   MCH 30.3 26.0 - 34.0 pg   MCHC 34.3 30.0 - 36.0 g/dL   RDW 12.4 11.5 - 15.5 %   Platelets 277 150 - 400 K/uL   Neutrophils Relative % 64 %   Neutro Abs 5.3 1.7 - 7.7 K/uL   Lymphocytes Relative 26 %   Lymphs Abs 2.2 0.7 - 4.0 K/uL   Monocytes Relative 7 %   Monocytes Absolute 0.6 0.1 - 1.0 K/uL   Eosinophils Relative 3 %   Eosinophils Absolute 0.2 0.0 - 0.7 K/uL   Basophils Relative 0 %   Basophils Absolute 0.0 0.0 - 0.1 K/uL  Basic metabolic panel     Status: Abnormal   Collection Time: 04/25/15  8:24 PM  Result Value Ref Range   Sodium 142 135 - 145 mmol/L   Potassium 4.2 3.5 - 5.1 mmol/L   Chloride 105 101 - 111 mmol/L   CO2 28 22 -  32 mmol/L   Glucose, Bld 98 65 - 99 mg/dL   BUN 10 6 - 20 mg/dL   Creatinine, Ser 1.02 (H) 0.44 - 1.00 mg/dL   Calcium 9.6 8.9 - 10.3 mg/dL   GFR calc non Af Amer >60 >60 mL/min   GFR calc Af Amer >60 >60 mL/min    Comment: (NOTE) The eGFR has been calculated using the CKD EPI equation. This calculation has not been validated in all clinical situations. eGFR's persistently <60 mL/min signify possible Chronic Kidney Disease.    Anion gap 9 5 - 15  Ethanol     Status: None   Collection Time: 04/25/15  8:24 PM  Result Value Ref Range   Alcohol, Ethyl (B) <5 <5 mg/dL    Comment:        LOWEST DETECTABLE LIMIT FOR SERUM ALCOHOL IS 5 mg/dL FOR MEDICAL PURPOSES ONLY     Physical Findings: AIMS: Facial and Oral Movements Muscles of Facial Expression: None, normal Lips and Perioral Area: None, normal Jaw: None, normal Tongue: None, normal,Extremity  Movements Upper (arms, wrists, hands, fingers): None, normal Lower (legs, knees, ankles, toes): None, normal, Trunk Movements Neck, shoulders, hips: None, normal, Overall Severity Severity of abnormal movements (highest score from questions above): None, normal Incapacitation due to abnormal movements: None, normal Patient's awareness of abnormal movements (rate only patient's report): No Awareness, Dental Status Current problems with teeth and/or dentures?: No Does patient usually wear dentures?: No  CIWA:  CIWA-Ar Total: 1 COWS:  COWS Total Score: 1  Musculoskeletal: Strength & Muscle Tone: within normal limits Gait & Station: normal Patient leans: N/A  Psychiatric Specialty Exam: ROS at present denies headache, denies chest pain, denies shortness of breath, has reported diarrhea.   Blood pressure 110/68, pulse 80, temperature 98 F (36.7 C), temperature source Oral, resp. rate 18, height '5\' 6"'  (1.676 m), weight 156 lb (70.761 kg).Body mass index is 25.19 kg/(m^2).  General Appearance: Well Groomed  Engineer, water::  Good   Speech:  Normal Rate- not pressured   Volume:  Normal  Mood:  reports mood is improved  Affect:  Appropriate- not overtly irritable or expansive at this time  Thought Process:  Linear  Orientation:  Other:  fully alert and attentive   Thought Content:  denies hallucinations, no delusions, not internally preoccupied   Suicidal Thoughts:  No at this time denies any suicidal ideations, denies any self injurious ideations, contracts for safety on unit   Homicidal Thoughts:  No  Memory:  Recent and remote grossly intact   Judgement:  Fair  Insight:  Fair  Psychomotor Activity:  Normal  Concentration:  Good  Recall:  Good  Fund of Knowledge:Good  Language: Good  Akathisia:  Negative  Handed:  Right  AIMS (if indicated):     Assets:  Desire for Improvement Housing Resilience  ADL's:  Intact  Cognition: WNL  Sleep:  Number of Hours: 6.75  Assessment - at this time patient improved compared to her admission- she is presenting less irritable, less hyper verbal, less intrusive today, and is currently reporting a subjective sense of significant mood improvement . Currently on Seroquel trial, which thus far she is tolerating well. We have reviewed side effects, to include risk of weight gain, metabolic disturbances, and movement disorders on Seroquel. Patient hoping for discharge soon, as she is looking forward to reunite with her children/loved ones .  Treatment Plan Summary: Daily contact with patient to assess and evaluate symptoms and progress in treatment, Medication management, Plan inpatient admission and medications as below  Encourage group, milieu participation to work on coping skills and symptom reduction Treatment team working on disposition options Continue Wellbutrin XL at 300 mgrs QDAY - we reviewed side effects and potential for antidepressant to increase mood instability, hypomanic symptoms but she states she has been on Wellbutrin for a long time and does better on it, feels  more stable on it. Continue Seroquel now at 100 mgrs QHS for mood disorder, bipolar disorder  Continue Seroquel 25 mgrs BID PRN for anxiety, agitation as needed  Continue Lamictal 25 mgrs QDAY for mood disorder, depression  Elese Rane 04/27/2015, 5:32 PM

## 2015-04-28 MED ORDER — MAGNESIUM HYDROXIDE 400 MG/5ML PO SUSP: 30.0000 mL | Freq: Every day | ORAL | Status: DC | PRN

## 2015-04-28 MED ORDER — ALUM & MAG HYDROXIDE-SIMETH 200-200-20 MG/5ML PO SUSP: 30.0000 mL | ORAL | Status: DC | PRN

## 2015-04-28 MED ORDER — QUETIAPINE FUMARATE 50 MG PO TABS
125.0000 mg | ORAL_TABLET | Freq: Every day | ORAL | Status: DC
  Administered 2015-04-28: 125 mg via ORAL
  Filled 2015-04-28 (×2): qty 2.5
  Filled 2015-04-28: qty 3
  Filled 2015-04-28: qty 2.5

## 2015-04-28 MED ORDER — ACETAMINOPHEN 325 MG PO TABS: 650.0000 mg | ORAL_TABLET | Freq: Four times a day (QID) | ORAL | Status: DC | PRN

## 2015-04-28 NOTE — [facility] Suicide Risk Assessment (Signed)
[place] INPATIENT:  Family/Significant Other Suicide Prevention Education  Suicide Prevention Education:  Education Completed; Kimmora Risenhoover, Pt's husband [phone],  has been identified by the patient as the family member/significant other with whom the patient will be residing, and identified as the person(s) who will aid the patient in the event of a mental health crisis (suicidal ideations/suicide attempt).  With written consent from the patient, the family member/significant other has been provided the following suicide prevention education, prior to the and/or following the discharge of the patient.  The suicide prevention education provided includes the following:  Suicide risk factors  Suicide prevention and interventions  National Suicide Hotline telephone number  [facility] assessment telephone number  [facility] Emergency Assistance [place] and/or Residential Mobile Crisis Unit telephone number  Request made of family/significant other to:  Remove weapons (e.g., guns, rifles, knives), all items previously/currently identified as safety concern.    Remove drugs/medications (over-the-counter, prescriptions, illicit drugs), all items previously/currently identified as a safety concern.  The family member/significant other verbalizes understanding of the suicide prevention education information provided.  The family member/significant other agrees to remove the items of safety concern listed above.  Bo Mcclintock 04/28/2015, 4:30 PM

## 2015-04-28 NOTE — Progress Notes (Signed)
D:  Patient's self inventory sheet, patient sleeps good, sleep medication is helpful.  Fair appetite, normal energy level, good concentration.  Denied depression and hopeless.  Rated anxiety #1.  Denied withdrawals.  Denied SI.  Physical problems, tummy, worst pain #10 in past 124 hrs.   Goal is to focus, exercise, talk to MD.   Plans to work on discharge.  Does have discharge plans. A:  Medications administered per MD orders.  Emotional support and encouragement given patient. R:  Denied SI and HI, contracts for safety.  Denied A/V hallucinations.  Safety maintained with 15 minute checks.

## 2015-04-28 NOTE — Progress Notes (Signed)
   04/28/15 1300  Clinical Encounter Type  Visited With Patient (grief group)  Visit Type Behavioral Health (Grief group)    Samantha Lyons was present throughout group.  Left at end of group as facilitator was wrapping up group and attending to another group member.  Samantha Lyons wished to interject a comment and facilitator directed attention to another group member who had her hand up and had not spoken much in group.  Samantha Lyons left and told another group member that she would tell her later what she wanted to say.   Throughout group Samantha Lyons was attentive. Somewhat hyper-verbal and difficult to redirect to open space for other group members to speak.  She spoke of series of losses around caring for her husband, a disabled veteran, and their 5 children.  Spoke with group about caregiver fatigue and isolation of grief as a caregiver.  Also spoke with group about isolation from affection and her desire to feel cared for.  Felt supported by several group members who identified with not feeling cared for by family or spouses.   Samantha Lyons, [place]

## 2015-04-28 NOTE — Progress Notes (Signed)
D: Patient alert and oriented x 4. Patient denies pain/SI/HI/AVH. Patient states she is feeling better. Patient is currently resting in bed with eyes closed. No sign or symptoms of distress.  A: Staff to monitor Q 15 mins for safety. Encouragement and support offered. Scheduled medications administered per orders. R: Patient remains safe on the unit. Patient attended group tonight. Patient visible on hte unit and interacting with peers. Patient taking administered medications.

## 2015-04-28 NOTE — Progress Notes (Signed)
Adult Psychoeducational Group Note  Date:  04/28/2015 Time:  9:20 PM  Group Topic/Focus:  Wrap-Up Group:   The focus of this group is to help patients review their daily goal of treatment and discuss progress on daily workbooks.  Participation Level:  Active  Participation Quality:  Attentive  Affect:  Appropriate  Cognitive:  Appropriate  Insight: Good  Engagement in Group:  Engaged  Modes of Intervention:  Education  Additional Comments:  Pt stated her day was good and she worked on her deep breathing exercise which helped her cope with things throughout the day. Pt mentioned she can tell her medication has been working and she understand why she is here and what she need to work on.   Samantha Lyons 04/28/2015, 9:20 PM

## 2015-04-28 NOTE — [facility] Group Notes (Signed)
[place] LCSW Group Therapy 04/28/2015 1:15 PM  Type of Therapy: Group Therapy- Feelings about Diagnosis  Participation Level: Active   Participation Quality:  Distracting but Relevant  Affect:  Appropriate  Cognitive: Alert and Oriented   Insight:  Developing   Engagement in Therapy: Developing/Improving and Engaged   Modes of Intervention: Clarification, Confrontation, Discussion, Education, Exploration, Limit-setting, Orientation, Problem-solving, Rapport Building, Art therapist, Socialization and Support  Description of Group:   This group will allow patients to explore their thoughts and feelings about diagnoses they have received. Patients will be guided to explore their level of understanding and acceptance of these diagnoses. Facilitator will encourage patients to process their thoughts and feelings about the reactions of others to their diagnosis, and will guide patients in identifying ways to discuss their diagnosis with significant others in their lives. This group will be process-oriented, with patients participating in exploration of their own experiences as well as giving and receiving support and challenge from other group members.  Summary of Progress/Problems:  Pt continues to be hyper verbal, monopolizing group discussion with little insight into this distraction. However, Pt's participation is largely relevant; she is encouraging to other group members. Pt continues to focus and repeat that she has 5 "mentally challenged" children who have mental illness. Pt describes how she has learned to accept her diagnosis and love herself for who she is.   Therapeutic Modalities:   Cognitive Behavioral Therapy Solution Focused Therapy Motivational Interviewing Relapse Prevention Therapy  Peri Maris, LCSWA 04/28/2015 3:19 PM

## 2015-04-28 NOTE — Telephone Encounter (Signed)
Patient is in hospital at this time she will be in for about 7 days. Patient husband states he will have her call us when she gets out of hospital.

## 2015-04-28 NOTE — Progress Notes (Signed)
[facility] MD Progress Note  04/28/2015 2:53 PM AMELIANA BRASHEAR  MRN:  [id number] Subjective:   Patient states ' I feel better ." I know I have been depressed for a long time , but now I know I am not, I feel much better.'  Objective : I have discussed case with treatment team and have met with patient. Patient is a 44 year old married female, who presented to the emergency room due to suicidal ideations , depression,and vague irritability, mood instability. She was being prescribed  Lexapro 40 mg QDAY and Wellbutrin 300 mg QDAY for mood disorder . Pt today seen as improving. Pt reports she is less depressed , less anxious and tolerating her medications well. Pt per staff - has been compliant on medications. Pt however continues to need redirection on the unit , since she is seen as having problems with her room mate . Pt to be moved away from room mate to another room . Pt to be monitored on the unit.  . Principal Problem: Bipolar disorder ([place]) Diagnosis:   Patient Active Problem List   Diagnosis Date Noted  . Bipolar disorder ([place]) [F31.9] 04/26/2015  . Encounter to establish care [Z71.89] 04/24/2015  . Abdominal pain [R10.9] 04/24/2015  . Fatigue [R53.83] 04/24/2015  . Diarrhea [R19.7] 04/24/2015  . Fecal incontinence [R15.9] 04/24/2015  . Healthcare maintenance [Z00.00] 04/24/2015  . Anxiety [F41.9]   . Depression [F32.9]   . Endometriosis [N80.9]   . Tobacco use [Z72.0]    Total Time spent with patient: 25 minutes     Past Medical History:  Past Medical History  Diagnosis Date  . Anxiety   . Depression   . Endometriosis   . Tobacco use   . Colitis   . Cholelithiasis   . History of chicken pox   . Eating disorder   . GERD (gastroesophageal reflux disease)   . Seasonal allergies   . Blood in stool     Past Surgical History  Procedure Laterality Date  . Abdominal hysterectomy  10/23/08  . Cesarean section    . Abdominoplasty    . Bartholin gland cyst excision    .  Cholecystectomy N/A 01/27/2014    Procedure: LAPAROSCOPIC CHOLECYSTECTOMY;  Surgeon: [name], MD;  Location: [facility];  Service: General;  Laterality: N/A;   Family History:  Family History  Problem Relation Age of Onset  . Osteoporosis Mother   . Arthritis Mother     OA  . Stroke Mother   . Hypertension Mother   . Heart disease Father   . Osteoporosis Father   . Diabetes Brother   . Colon cancer Maternal Grandmother     Social History:  History  Alcohol Use No    Comment: beer occ     History  Drug Use  . Yes  . Special: Marijuana    Social History   Social History  . Marital Status: Married    Spouse Name: N/A  . Number of Children: N/A  . Years of Education: N/A   Social History Main Topics  . Smoking status: Current Every Day Smoker -- 0.25 packs/day for 20 years    Types: Cigarettes  . Smokeless tobacco: None  . Alcohol Use: No     Comment: beer occ  . Drug Use: Yes    Special: Marijuana  . Sexual Activity: Yes    Birth Control/ Protection: Surgical, None   Other Topics Concern  . None   Social History Narrative  Married. High school grad.   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    Smoker. MJ use.  Herbal remedies, seat belts, wears a bike helmet. Exercises more than 3 times a week.    SMoke alarm in the home.    Guns in the home (in a locked cabinet)   Additional Social History:   Sleep:  Improved  Appetite:  Good  Current Medications: Current Facility-Administered Medications  Medication Dose Route Frequency Provider Last Rate Last Dose  . acetaminophen (TYLENOL) tablet 650 mg  650 mg Oral Q6H PRN Shandie Bertz, MD      . alum & mag hydroxide-simeth (MAALOX/MYLANTA) 200-200-20 MG/5ML suspension 30 mL  30 mL Oral Q4H PRN Ursula Alert, MD      . Gaetana Michaelis 0.3m(8/2)prog20/ml   2 Units Topical BID FJenne Campus MD   2 Units at 04/28/15 0746  . buPROPion (WELLBUTRIN XL) 24 hr tablet 300 mg  300 mg Oral Daily CDara Hoyer PA-C   300 mg at 04/28/15 0751  . feeding supplement (ENSURE ENLIVE) (ENSURE ENLIVE) liquid 237 mL  237 mL Oral BID BM FMyer PeerCobos, MD   237 mL at 04/28/15 1447  . lamoTRIgine (LAMICTAL) tablet 25 mg  25 mg Oral Daily SKathlee Nations MD   25 mg at 04/28/15 0748  . loperamide (IMODIUM) capsule 2 mg  2 mg Oral Q2H PRN JBenjamine Mola FNP   2 mg at 04/27/15 02585 . magnesium hydroxide (MILK OF MAGNESIA) suspension 30 mL  30 mL Oral Daily PRN Irja Wheless, MD      . nicotine (NICODERM CQ - dosed in mg/24 hr) patch 7 mg  7 mg Transdermal Daily FJenne Campus MD   7 mg at 04/28/15 0748  . ondansetron (ZOFRAN-ODT) disintegrating tablet 4 mg  4 mg Oral Q8H PRN JBenjamine Mola FNP   4 mg at 04/28/15 0756  . oxymetazoline (AFRIN) 0.05 % nasal spray 1 spray  1 spray Each Nare QHS Fernando A Cobos, MD      . QUEtiapine (SEROQUEL) tablet 125 mg  125 mg Oral QHS Brek Reece, MD      . QUEtiapine (SEROQUEL) tablet 25 mg  25 mg Oral BID PRN SKathlee Nations MD   25 mg at 04/28/15 1107  . Testosterone 0.296mml cr  1 mL Vaginal Q M,W,F-2000 FeMyer Peerobos, MD   1 mL at 04/27/15 1957    Lab Results:  No results found for this or any previous visit (from the past 4830our(s)).  Physical Findings: AIMS: Facial and Oral Movements Muscles of Facial Expression: None, normal Lips and Perioral Area: None, normal Jaw: None, normal Tongue: None, normal,Extremity Movements Upper (arms, wrists, hands, fingers): None, normal Lower (legs, knees, ankles, toes): None, normal, Trunk Movements Neck, shoulders, hips: None, normal, Overall Severity Severity of abnormal movements (highest score from questions above): None, normal Incapacitation due to abnormal movements: None, normal Patient's awareness of abnormal movements (rate only patient's report): No Awareness, Dental Status Current problems with teeth and/or dentures?: No Does patient usually wear dentures?: No  CIWA:  CIWA-Ar Total: 2 COWS:  COWS  Total Score: 1  Musculoskeletal: Strength & Muscle Tone: within normal limits Gait & Station: normal Patient leans: N/A  Psychiatric Specialty Exam: Review of Systems  Psychiatric/Behavioral: The patient is nervous/anxious.   All other systems reviewed and are negative.  at present denies headache, denies chest pain, denies shortness of breath, has reported diarrhea.  Blood pressure 122/78, pulse 72, temperature 97.9 F (36.6 C), temperature source Oral, resp. rate 16, height _0  (1.676 m), weight 70.761 kg (156 lb).Body mass index is 25.19 kg/(m^2).  General Appearance: Well Groomed  Engineer, water::  Good  Speech:  Pressured  Volume:  Normal  Mood:  reports mood is improved  Affect:  Appropriate- not overtly irritable or expansive at this time  Thought Process:  Linear  Orientation:  Other:  fully alert and attentive   Thought Content:  denies hallucinations, no delusions, not internally preoccupied   Suicidal Thoughts:  No at this time denies any suicidal ideations, denies any self injurious ideations, contracts for safety on unit   Homicidal Thoughts:  No  Memory:  Recent and remote grossly intact , immediate - fair  Judgement:  Fair  Insight:  Fair  Psychomotor Activity:  Normal  Concentration:  Good  Recall:  Good  Fund of Knowledge:Good  Language: Good  Akathisia:  Negative  Handed:  Right  AIMS (if indicated):     Assets:  Desire for Improvement Housing Resilience  ADL's:  Intact  Cognition: WNL  Sleep:  Number of Hours: 5.25   Treatment Plan Summary: Daily contact with patient to assess and evaluate symptoms and progress in treatment, Medication management, Plan inpatient admission and medications as below  Encourage group, milieu participation to work on coping skills and symptom reduction Treatment team working on disposition options Continue Wellbutrin XL at 300 mgrs QDAY - ( As per [employer] _ we reviewed side effects and potential for antidepressant to  increase mood instability, hypomanic symptoms but she states she has been on Wellbutrin for a long time and does better on it, feels more stable on it.) Increase  Seroquel to 125  mgrs po QHS for mood disorder, bipolar disorder  Continue Seroquel 25 mgrs BID PRN for anxiety, agitation as needed  Continue Lamictal 25 mgrs QDAY for mood disorder, depression. CSW will work on disposition.  Raea Magallon MD 04/28/2015, 2:53 PM

## 2015-04-28 NOTE — BHH Group Notes (Signed)
The focus of this group is to educate the patient on the purpose and policies of crisis stabilization and provide a format to answer questions about their admission.  The group details unit policies and expectations of patients while admitted.  Patient attended 0900 nurse education orientation group this morning. Patient actively participated and appropriate affect. Patient was alert. Patient had appropriate insight and appropriate engagement. Today patient will work on 3 goals for discharge.

## 2015-04-29 ENCOUNTER — Encounter (HOSPITAL_COMMUNITY): Payer: Self-pay | Admitting: Family

## 2015-04-29 DIAGNOSIS — F3162 Bipolar disorder, current episode mixed, moderate: Secondary | ICD-10-CM | POA: Insufficient documentation

## 2015-04-29 MED ORDER — QUETIAPINE FUMARATE 50 MG PO TABS: ORAL_TABLET | ORAL | Status: DC

## 2015-04-29 MED ORDER — BUPROPION HCL ER (XL) 300 MG PO TB24: 300.0000 mg | ORAL_TABLET | Freq: Every day | ORAL | Status: DC

## 2015-04-29 MED ORDER — LAMOTRIGINE 25 MG PO TABS: 25.0000 mg | ORAL_TABLET | Freq: Every day | ORAL | Status: DC

## 2015-04-29 NOTE — Progress Notes (Signed)
  [facility] Adult Case Management Discharge Plan :  Will you be returning to the same living situation after discharge:  Yes,  Pt returning home At discharge, do you have transportation home?: Yes,  husband to provide transportation Do you have the ability to pay for your medications: Yes,  Pt provided with prescriptions  Release of information consent forms completed and in the chart;  Patient's signature needed at discharge.  Patient to Follow up at: Follow-up Information    Follow up with [facility] On 05/18/2015.   Why:  at 9:00am with [facility] for therapy. This was Charlotte's first available appointment.   Contact information:   [place], [place] [address] Phone: [phone] Fax: [phone]      Follow up with [facility] On 05/11/2015.   Why:  at 11:00am for medication management with [place] information:   [place], [place] [address] Phone [phone] Fax [phone]      Patient denies SI/HI: Yes,  Pt denies    Safety Planning and Suicide Prevention discussed: Yes,  with Pt's husband; see SPE note for further details  Have you used any form of tobacco in the last 30 days? (Cigarettes, Smokeless Tobacco, Cigars, and/or Pipes): Yes  Has patient been referred to the Quitline?: Yes, faxed on 04/29/15  Bo Mcclintock 04/29/2015, 12:25 PM

## 2015-04-29 NOTE — Progress Notes (Signed)
D: Patient alert and oriented x 4. Patient denies pain SI/HI/AVH.  A: Staff to monitor Q 15 mins for safety. Encouragement and support offered. Scheduled medications administered per orders. R: Patient remains safe on the unit. Patient attended group tonight. Patient visible on hte unit and interacting with peers. Patient taking administered medications.  

## 2015-04-29 NOTE — Progress Notes (Signed)
Discharge D- Patient verbalizes readiness for discharge: Denies SI/HI/AVH/Pain A- Discharge instructions read and discussed with patient.  All belongings returned to patient to include pocketbook, duffel bag, home medications. R- Patient was cooperative with discharge process.  Patient verbalized understanding of discharge instructions.  Signed for return of belongings. Escorted to the lobby.  Patient was observed leaving with her husband.

## 2015-04-29 NOTE — BHH Suicide Risk Assessment (Signed)
[facility] Discharge Suicide Risk Assessment   Demographic Factors:  44 year old married female, lives with husband and children  Total Time spent with patient: 30 minutes  Musculoskeletal: Strength & Muscle Tone: within normal limits Gait & Station: normal Patient leans: N/A  Psychiatric Specialty Exam: Physical Exam  ROS  Blood pressure 125/89, pulse 86, temperature 98.2 F (36.8 C), temperature source Oral, resp. rate 18, height 5\' 6"  (1.676 m), weight 156 lb (70.761 kg).Body mass index is 25.19 kg/(m^2).  General Appearance: Well Groomed  Engineer, water::  Good  Speech:  less pressured 409  Volume:  Normal  Mood:  improved, at this time denies depression  Affect:  Appropriate- not presenting as expansive or overly irritable at this time  Thought Process:  Goal Directed and Linear  Orientation:  Full (Time, Place, and Person)  Thought Content:  denies hallucinations, no delusions   Suicidal Thoughts:  No- denies any current suicidal or self injurious ideations  Homicidal Thoughts:  No  Memory:  recent and remote grossly intact   Judgement:  Other:  improved   Insight:  improved   Psychomotor Activity:  Normal and no psychomotor agitation at this time  Concentration:  Good  Recall:  Good  Fund of Knowledge:Good  Language: Good  Akathisia:  Negative  Handed:  Right  AIMS (if indicated):     Assets:  Communication Skills Desire for Improvement Resilience  Sleep:  Number of Hours: 6  Cognition: WNL  ADL's:  Intact   Have you used any form of tobacco in the last 30 days? (Cigarettes, Smokeless Tobacco, Cigars, and/or Pipes): Yes  Has this patient used any form of tobacco in the last 30 days? (Cigarettes, Smokeless Tobacco, Cigars, and/or Pipes) Yes, A prescription for an FDA-approved tobacco cessation medication was offered at discharge and the patient refused  Mental Status Per Nursing Assessment::   On Admission:     Current Mental Status by Physician: Patient currently  improved compared to admission- at present not presenting with overt restlessness, agitation, pressured speech, irritability or expansiveness. States she feels her mood is " normal" today, and affect is appropriate. No thought disorder, no SI or HI, no psychotic symptoms reported or noted, future oriented, and looking forward to reunite with her children, husband   Loss Factors: Husband is disabled , primary care giver for several children,  Historical Factors: History of Mood Disorder, this was patient's first psychiatric admission, denies prior history of suicide attempts .  Risk Reduction Factors:   Responsible for children under 42 years of age, Sense of responsibility to family, Living with another person, especially a relative and Positive coping skills or problem solving skills  Continued Clinical Symptoms:   as noted, patient improved compared to admission- at present reports improved mood, affect is improved, and does not endorse any SI or HI. No psychotic symptoms. Future oriented .  Cognitive Features That Contribute To Risk:  No gross cognitive deficits noted upon discharge. Is alert , attentive, and oriented x 3   Suicide Risk:  Mild:  Suicidal ideation of limited frequency, intensity, duration, and specificity.  There are no identifiable plans, no associated intent, mild dysphoria and related symptoms, good self-control (both objective and subjective assessment), few other risk factors, and identifiable protective factors, including available and accessible social support.  Principal Problem: Bipolar disorder [facility]) Discharge Diagnoses:  Patient Active Problem List   Diagnosis Date Noted  . Bipolar disorder ([place]) [F31.9] 04/26/2015  . Encounter to establish care [Z71.89] 04/24/2015  .  Abdominal pain [R10.9] 04/24/2015  . Fatigue [R53.83] 04/24/2015  . Diarrhea [R19.7] 04/24/2015  . Fecal incontinence [R15.9] 04/24/2015  . Healthcare maintenance [Z00.00] 04/24/2015  .  Anxiety [F41.9]   . Depression [F32.9]   . Endometriosis [N80.9]   . Tobacco use [Z72.0]     Follow-up Information    Follow up with [facility] On 05/18/2015.   Why:  at 9:00am with [facility] for therapy. This was Charlotte's first available appointment.   Contact information:   [place], [place] [address] Phone: [phone] Fax: [phone]      Follow up with [facility] On 05/11/2015.   Why:  at 11:00am for medication management with [place] information:   [address] [place], [place] [address] Phone [phone] Fax [phone]      Plan Of Care/Follow-up recommendations:  Activity:  as tolerated  Diet:  Regular Tests:  NA Other:  See below   Is patient on multiple antipsychotic therapies at discharge:  No   Has Patient had three or more failed trials of antipsychotic monotherapy by history:  No  Recommended Plan for Multiple Antipsychotic Therapies: NA At this time patient is improved compared to admission, is requesting discharge, and there are no current grounds for involuntary commitment . Patient is leaving in good spirits . She is planning on returning home. Plans to follow up as above   Janece Laidlaw, [place] 04/29/2015, 1:35 PM

## 2015-04-29 NOTE — Tx Team (Signed)
Interdisciplinary Treatment Plan Update (Adult) Date: 04/29/2015   Date: 04/29/2015 12:20 PM  Progress in Treatment:  Attending groups: Yes  Participating in groups: Yes  Taking medication as prescribed: Yes  Tolerating medication: Yes  Family/Significant othe contact made: Yes, with husband Patient understands diagnosis: Yes Discussing patient identified problems/goals with staff: Yes  Medical problems stabilized or resolved: Yes  Denies suicidal/homicidal ideation: Yes Patient has not harmed self or Others: Yes   New problem(s) identified: None identified at this time.   Discharge Plan or Barriers: Pt will return home and follow-up with [facility] and [place]  Additional comments: n/a   Reason for Continuation of Hospitalization:  Anxiety Depression Medication stabilization Suicidal ideation Mania  Estimated length of stay: 0 days  Review of initial/current patient goals per problem list:   1.  Goal(s): Patient will participate in aftercare plan  Met:  Yes  Target date: 3-5 days from date of admission   As evidenced by: Patient will participate within aftercare plan AEB aftercare provider and housing plan at discharge being identified.  04/29/2015: Pt will discharge home and follow-up with outpatient providers  2.  Goal (s): Patient will exhibit decreased depressive symptoms and suicidal ideations.  Met:  Yes  Target date: 3-5 days from date of admission   As evidenced by: Patient will utilize self rating of depression at 3 or below and demonstrate decreased signs of depression or be deemed stable for discharge by MD. 04/29/2015: Pt denies depression; rates it at 0/10.  3.  Goal(s): Patient will demonstrate decreased signs and symptoms of anxiety.  Met:  Yes  Target date: 3-5 days from date of admission   As evidenced by: Patient will utilize self rating of anxiety at 3 or below and demonstrated decreased signs of anxiety, or be deemed stable  for discharge by MD 04/29/2015: Pt rates anxiety at 2/10.  6. Goal (s):  Patient will demonstrate decreased signs of mania . Met:  Adequate for DC . Target date: 3-5 days from date of admission  . As evidenced by:  Patient demonstrate decreased signs of mania AEB decreased mood instability and demonstration of stable mood    -04/29/2015: Although Pt's speech is still pressured, MD feels that Pt's manic symptoms have    reduced to the point that they can be managed in the outpatient setting.  Attendees:  Patient:    Family:    Physician: Dr. Parke Poisson, MD  04/29/2015 12:20 PM  Nursing: Lars Pinks, RN Case manager  04/29/2015 12:20 PM  Clinical Social Worker Norman Clay, MSW 04/29/2015 12:20 PM  Other: Lucinda Dell, Beverly Sessions Liasion 04/29/2015 12:20 PM  Clinical:  Jerrilyn Cairo, RN 04/29/2015 12:20 PM  Other: , RN Charge Nurse 04/29/2015 12:20 PM  Other:     Peri Maris, [place] MSW

## 2015-04-29 NOTE — Discharge Summary (Signed)
Physician Discharge Summary Note  Patient:  Samantha Lyons is an 44 y.o., female MRN:  [id number] DOB:  12-26-1970 Patient phone:  [phone] (home)  Patient address:   [address] [place] [address],  Total Time spent with patient: 30 minutes   Date of Admission:  04/26/2015 Date of Discharge: 04/29/2015  Reason for Admission:  Per Admission Note Patient is 44 year old Caucasian, unemployed female who presented to the emergency room due to suicidal thoughts, depression, irritability and having mood swings. Patient is getting treatment from her primary care physician for her depression. She is taking Lexapro 40 mg and Wellbutrin 300 mg. However she has not feeling better. She admitted having severe mood swing, anger, very emotional, highs and lows. Patient admitted poor sleep, irritability and lately started to have suicidal thoughts. She also endorse occasional hallucination and endorse paranoia. She believe her medicines are not working. She does not feel safe. Patient appears very emotional, tearful, easily crying and cannot concentrate. She admitted feeling hopeless, helpless and could not function. She is smoking marijuana. Her UDS is positive for cannabis. She has 5 teenagers and she admitted she has no energy and motivation to do things. She believe she has bipolar disorder. In the past she had tried Seroquel and lithium. She endorse lithium worked great but she stopped taking the medication because she was feeling better. Her UDS is positive for cannabis, her basic chemistry shows creatinine 1.02.  Principal Problem: Bipolar disorder [facility]) Discharge Diagnoses: Patient Active Problem List   Diagnosis Date Noted  . Bipolar disorder ([place]) [F31.9] 04/26/2015  . Encounter to establish care [Z71.89] 04/24/2015  . Abdominal pain [R10.9] 04/24/2015  . Fatigue [R53.83] 04/24/2015  . Diarrhea [R19.7] 04/24/2015  . Fecal incontinence [R15.9] 04/24/2015  . Healthcare  maintenance [Z00.00] 04/24/2015  . Anxiety [F41.9]   . Depression [F32.9]   . Endometriosis [N80.9]   . Tobacco use [Z72.0]     Musculoskeletal: Strength & Muscle Tone: within normal limits Gait & Station: normal Patient leans: N/A  Psychiatric Specialty Exam:  See SRA Physical Exam  Vitals reviewed. Constitutional: She is oriented to person, place, and time. She appears well-developed and well-nourished.  Neck: Normal range of motion.  Respiratory: Breath sounds normal.  Musculoskeletal: Normal range of motion.  Neurological: She is alert and oriented to person, place, and time.    Review of Systems  Psychiatric/Behavioral: Negative for suicidal ideas. Depression: stable. Nervous/anxious: stable.     Blood pressure 125/89, pulse 86, temperature 98.2 F (36.8 C), temperature source Oral, resp. rate 18, height 5\' 6"  (1.676 m), weight 70.761 kg (156 lb).Body mass index is 25.19 kg/(m^2).  Have you used any form of tobacco in the last 30 days? (Cigarettes, Smokeless Tobacco, Cigars, and/or Pipes): Yes  Has this patient used any form of tobacco in the last 30 days? (Cigarettes, Smokeless Tobacco, Cigars, and/or Pipes) Yes, A prescription for an FDA-approved tobacco cessation medication was offered at discharge and the patient refused  Past Medical History:  Past Medical History  Diagnosis Date  . Anxiety   . Depression   . Endometriosis   . Tobacco use   . Colitis   . Cholelithiasis   . History of chicken pox   . Eating disorder   . GERD (gastroesophageal reflux disease)   . Seasonal allergies   . Blood in stool     Past Surgical History  Procedure Laterality Date  . Abdominal hysterectomy  10/23/08  . Cesarean section    . Abdominoplasty    .  Bartholin gland cyst excision    . Cholecystectomy N/A 01/27/2014    Procedure: LAPAROSCOPIC CHOLECYSTECTOMY;  Surgeon: [name], MD;  Location: [facility];  Service: General;  Laterality: N/A;   Family History:  Family History   Problem Relation Age of Onset  . Osteoporosis Mother   . Arthritis Mother     OA  . Stroke Mother   . Hypertension Mother   . Heart disease Father   . Osteoporosis Father   . Diabetes Brother   . Colon cancer Maternal Grandmother    Social History:  History  Alcohol Use No    Comment: beer occ     History  Drug Use  . Yes  . Special: Marijuana    Social History   Social History  . Marital Status: Married    Spouse Name: N/A  . Number of Children: N/A  . Years of Education: N/A   Social History Main Topics  . Smoking status: Current Every Day Smoker -- 0.25 packs/day for 20 years    Types: Cigarettes  . Smokeless tobacco: None  . Alcohol Use: No     Comment: beer occ  . Drug Use: Yes    Special: Marijuana  . Sexual Activity: Yes    Birth Control/ Protection: Surgical, None   Other Topics Concern  . None   Social History Narrative   Married. High school grad.   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    Smoker. MJ use.  Herbal remedies, seat belts, wears a bike helmet. Exercises more than 3 times a week.    SMoke alarm in the home.    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 She was treated discharged with the medications listed below under Medication List.  Medical problems were identified and treated as needed.  Home medications were restarted as appropriate.  Improvement was monitored by observation and Samantha Lyons daily report of symptom reduction.  Emotional and mental status was monitored by daily  self-inventory reports completed by Samantha Lyons and clinical staff.         Samantha Lyons was evaluated by the treatment team for stability and plans for continued recovery upon discharge.  Samantha Lyons motivation was an integral factor for scheduling further treatment.  Employment, transportation, bed availability, health status, family support, and any pending legal issues were also considered during his hospital stay. She was offered further treatment options upon discharge including but not limited to Residential, Intensive Outpatient, and Outpatient treatment.  Samantha Lyons will follow up with the services as listed below under Follow Up Information.     Upon completion of this admission the patient was both mentally and medically stable for discharge denying suicidal/homicidal ideation, auditory/visual/tactile hallucinations, delusional thoughts and paranoia.      Consults:  psychiatry  Significant Diagnostic Studies:  labs: CBC/Diff, BMP, ETOH, UDS, Urinalysis, Pregnancy  Discharge Vitals:   Blood pressure 125/89, pulse 86, temperature 98.2 F (36.8 C), temperature source Oral,  resp. rate 18, height 5\' 6"  (1.676 m), weight 70.761 kg (156 lb). Body mass index is 25.19 kg/(m^2). Lab Results:   No results found for this or any previous visit (from the past 72 hour(s)).  Physical Findings: AIMS: Facial and Oral Movements Muscles of Facial Expression: None, normal Lips and Perioral Area: None, normal Jaw: None, normal Tongue: None, normal,Extremity Movements Upper (arms, wrists, hands, fingers): None, normal Lower (legs, knees, ankles, toes): None, normal, Trunk Movements Neck, shoulders, hips: None, normal, Overall Severity Severity of abnormal movements (highest score from questions above): None, normal Incapacitation due to abnormal movements: None, normal Patient's awareness of abnormal movements (rate only patient's report): No Awareness, Dental Status Current problems with  teeth and/or dentures?: No Does patient usually wear dentures?: No  CIWA:  CIWA-Ar Total: 2 COWS:  COWS Total Score: 1   See Psychiatric Specialty Exam and Suicide Risk Assessment completed by Attending Physician prior to discharge.  Discharge destination:  Home  Is patient on multiple antipsychotic therapies at discharge:  No   Has Patient had three or more failed trials of antipsychotic monotherapy by history:  No    Recommended Plan for Multiple Antipsychotic Therapies: NA      Discharge Instructions    Activity as tolerated - No restrictions    Complete by:  As directed      Diet general    Complete by:  As directed      Discharge instructions    Complete by:  As directed   Take all of you medications as prescribed by your mental healthcare provider.  Report any adverse effects and reactions from your medications to your outpatient provider promptly. Do not engage in alcohol and or illegal drug use while on prescription medicines. In the event of worsening symptoms call the crisis hotline, 911, and or go to the nearest emergency department for appropriate evaluation and treatment of symptoms. Follow-up with your primary care provider for your medical issues, concerns and or health care needs.   Keep all scheduled appointments.  If you are unable to keep an appointment call to reschedule.  Let the nurse know if you will need medications before next scheduled appointment.            Medication List    STOP taking these medications        diphenhydrAMINE 25 MG tablet  Commonly known as:  BENADRYL     escitalopram 10 MG tablet  Commonly known as:  LEXAPRO     NON FORMULARY      TAKE these medications      Indication   BIEST/PROGESTERONE TD  Place onto the skin 2 (two) times daily. 4 clicks applied to wither forearm daily (Biest 0.72md (8/2)/ Progest 20/40ml      buPROPion 300 MG 24 hr tablet  Commonly known as:  WELLBUTRIN XL  Take 1 tablet (300 mg total) by  mouth daily.   Indication:  Major Depressive Disorder     clonazePAM 1 MG tablet  Commonly known as:  KLONOPIN  Take 1 tablet (1 mg total) by mouth 2 (two) times daily as needed for anxiety.      lamoTRIgine 25 MG tablet  Commonly known as:  LAMICTAL  Take 1 tablet (25 mg total) by mouth daily.   Indication:  mood stabilizer     oxymetazoline 0.05 % nasal spray  Commonly known as:  AFRIN  Place 1 spray into both nostrils at bedtime.      QUEtiapine 50  MG tablet  Commonly known as:  SEROQUEL  Take 125 mg (2 1/2 tablets) daily at bedtime; and  May take 25 mg (1/2 tablet) twice a day as needed for anxiety   Indication:  anxiety/mood stablizer/sleep     TESTOSTERONE TD  Place 0.2 mg onto the skin every Monday, Wednesday, and Friday.        Follow-up Information    Follow up with [facility] On 05/18/2015.   Why:  at 9:00am with [facility] for therapy. This was Charlotte's first available appointment.   Contact information:   [place], [place] [address] Phone: [phone] Fax: [phone]      Follow-up recommendations:  Activity:  as toltrated Diet:  as totlrated  Comments:   Patient has been instructed to take medications as prescribed; and report adverse effects to outpatient provider.  Follow up with primary doctor for any medical issues and If symptoms recur report to nearest emergency or crisis hot line.   Patient has been instructed to take medications as prescribed; and report adverse effects to outpatient provider.  Follow up with primary doctor for any medical issues and If symptoms recur report to nearest emergency or crisis hot line.    Total Discharge Time: 30 min  Signed: Derrill Center FNP 04/29/2015, 11:31 AM   Patient seen, Suicide Assessment Completed.  Disposition Plan Reviewed

## 2015-05-08 ENCOUNTER — Ambulatory Visit: Payer: Self-pay | Admitting: Gastroenterology

## 2015-05-12 ENCOUNTER — Encounter: Payer: Self-pay | Admitting: Family Medicine

## 2015-05-12 DIAGNOSIS — E559 Vitamin D deficiency, unspecified: Secondary | ICD-10-CM | POA: Insufficient documentation

## 2015-05-12 DIAGNOSIS — E781 Pure hyperglyceridemia: Secondary | ICD-10-CM | POA: Insufficient documentation

## 2015-07-07 ENCOUNTER — Telehealth: Payer: Self-pay | Admitting: *Deleted

## 2015-07-07 NOTE — Telephone Encounter (Signed)
Started note in error

## 2015-07-09 ENCOUNTER — Ambulatory Visit: Payer: Self-pay | Admitting: Internal Medicine

## 2015-10-06 ENCOUNTER — Encounter: Payer: Self-pay | Admitting: Family Medicine

## 2015-10-06 ENCOUNTER — Ambulatory Visit (INDEPENDENT_AMBULATORY_CARE_PROVIDER_SITE_OTHER): Admitting: Family Medicine

## 2015-10-06 VITALS — BP 97/58 | HR 86 | Temp 98.1°F | Resp 20 | Wt 156.0 lb

## 2015-10-06 DIAGNOSIS — F1290 Cannabis use, unspecified, uncomplicated: Secondary | ICD-10-CM

## 2015-10-06 DIAGNOSIS — R110 Nausea: Secondary | ICD-10-CM

## 2015-10-06 DIAGNOSIS — F1210 Cannabis abuse, uncomplicated: Secondary | ICD-10-CM | POA: Diagnosis not present

## 2015-10-06 HISTORY — DX: Cannabis use, unspecified, uncomplicated: F12.90

## 2015-10-06 LAB — CBC WITH DIFFERENTIAL/PLATELET
BASOS PCT: 0.5 % (ref 0.0–3.0)
Basophils Absolute: 0.1 10*3/uL (ref 0.0–0.1)
EOS PCT: 2.6 % (ref 0.0–5.0)
Eosinophils Absolute: 0.2 10*3/uL (ref 0.0–0.7)
HCT: 43.8 % (ref 36.0–46.0)
HEMOGLOBIN: 14.9 g/dL (ref 12.0–15.0)
LYMPHS ABS: 2.4 10*3/uL (ref 0.7–4.0)
Lymphocytes Relative: 25 % (ref 12.0–46.0)
MCHC: 34.1 g/dL (ref 30.0–36.0)
MCV: 88 fl (ref 78.0–100.0)
MONO ABS: 0.6 10*3/uL (ref 0.1–1.0)
Monocytes Relative: 5.9 % (ref 3.0–12.0)
Neutro Abs: 6.3 10*3/uL (ref 1.4–7.7)
Neutrophils Relative %: 66 % (ref 43.0–77.0)
Platelets: 406 10*3/uL — ABNORMAL HIGH (ref 150.0–400.0)
RBC: 4.98 Mil/uL (ref 3.87–5.11)
RDW: 12.4 % (ref 11.5–15.5)
WBC: 9.6 10*3/uL (ref 4.0–10.5)

## 2015-10-06 LAB — URINALYSIS, ROUTINE W REFLEX MICROSCOPIC
Bilirubin Urine: NEGATIVE
Ketones, ur: NEGATIVE
Leukocytes, UA: NEGATIVE
Nitrite: NEGATIVE
TOTAL PROTEIN, URINE-UPE24: NEGATIVE
URINE GLUCOSE: NEGATIVE
Urobilinogen, UA: 0.2 (ref 0.0–1.0)
pH: 6 (ref 5.0–8.0)

## 2015-10-06 LAB — COMPREHENSIVE METABOLIC PANEL
ALBUMIN: 5.1 g/dL (ref 3.5–5.2)
ALK PHOS: 80 U/L (ref 39–117)
ALT: 18 U/L (ref 0–35)
AST: 17 U/L (ref 0–37)
BUN: 9 mg/dL (ref 6–23)
CHLORIDE: 98 meq/L (ref 96–112)
CO2: 30 mEq/L (ref 19–32)
Calcium: 10.3 mg/dL (ref 8.4–10.5)
Creatinine, Ser: 0.89 mg/dL (ref 0.40–1.20)
GFR: 73.15 mL/min (ref >60.00)
Glucose, Bld: 90 mg/dL (ref 70–99)
Potassium: 3.8 mEq/L (ref 3.5–5.1)
SODIUM: 137 meq/L (ref 135–145)
TOTAL PROTEIN: 8.1 g/dL (ref 6.0–8.3)
Total Bilirubin: 0.4 mg/dL (ref 0.2–1.2)

## 2015-10-06 MED ORDER — ONDANSETRON 4 MG PO TBDP: 4.0000 mg | ORAL_TABLET | Freq: Three times a day (TID) | ORAL | Status: DC | PRN

## 2015-10-06 NOTE — Progress Notes (Signed)
Patient ID: Samantha Lyons, female   DOB: [birth year], 45 y.o.   MRN: [id number]    Samantha Lyons , May 21, 1971, 45 y.o., female MRN: [id number]  CC: nausea Subjective: Pt presents for an acute OV with concerns of nausea. Patient is difficult to assess a timeline on her nausea. She states technically it's occurred since her hysterectomy, but a few days ago she felt like it started to become worse. She has seen commercials on TV that have her concerned about some of her psychiatric medications. She has been self tapering off of some of the medications because she doesn't feel confident that her new psychiatrist understands and which medications they put her on. She reports she never picked up the Abilify, therefore she did not start it. She is self tapering her lithium. She states the lithium increased her anxiety and so she is tapering off of it. She reports she continues to take the Wellbutrin daily.  Associated symptoms include she states she felt warm 2 days ago, but no fever. She states she has an appointment with her psychiatrist next week to talk to the director because she wants to see him to provider. She reports her family was sick a few weeks ago. She states she has leaky gut syndrome. She was referred to gastroenterology in October, she states she did not go to that appointment. Her weight has been stable. Denies THC use now, has been positive for [facility] in October 2016.  No Known Allergies Social History  Substance Use Topics  . Smoking status: Current Every Day Smoker -- 0.25 packs/day for 20 years    Types: Cigarettes  . Smokeless tobacco: Not on file  . Alcohol Use: No     Comment: beer occ   Past Medical History  Diagnosis Date  . Generalized anxiety disorder   . Depression   . Endometriosis   . Tobacco use   . Colitis   . Cholelithiasis   . History of chicken pox   . Eating disorder   . GERD (gastroesophageal reflux disease)   . Seasonal allergies   . Blood in stool   . Female  hypogonadism   . Bipolar disorder, unspecified ([place])   . Chronic fatigue syndrome   . Low testosterone level in female     05/08/2014   Past Surgical History  Procedure Laterality Date  . Abdominal hysterectomy  10/23/08  . Cesarean section    . Abdominoplasty    . Bartholin gland cyst excision    . Cholecystectomy N/A 01/27/2014    Procedure: LAPAROSCOPIC CHOLECYSTECTOMY;  Surgeon: [name], MD;  Location: [facility];  Service: General;  Laterality: N/A;   Family History  Problem Relation Age of Onset  . Osteoporosis Mother   . Arthritis Mother     OA  . Stroke Mother   . Hypertension Mother   . Heart disease Father   . Osteoporosis Father   . Diabetes Brother   . Colon cancer Maternal Grandmother   . Alzheimer's disease Father   . Alzheimer's disease Paternal Grandmother   . Cancer Maternal Grandmother   . Multiple sclerosis Brother   . Stroke Brother      Medication List       This list is accurate as of: 10/06/15 11:15 AM.  Always use your most recent med list.               BIEST/PROGESTERONE TD  Place onto the skin 2 (two) times daily. 4 clicks applied  to wither forearm daily (Biest 0.47md (8/2)/ Progest 20/81ml     buPROPion 300 MG 24 hr tablet  Commonly known as:  WELLBUTRIN XL  Take 1 tablet (300 mg total) by mouth daily.     clonazePAM 1 MG tablet  Commonly known as:  KLONOPIN  Take 1 tablet (1 mg total) by mouth 2 (two) times daily as needed for anxiety.     lamoTRIgine 25 MG tablet  Commonly known as:  LAMICTAL  Take 1 tablet (25 mg total) by mouth daily.     lithium 300 MG tablet     oxymetazoline 0.05 % nasal spray  Commonly known as:  AFRIN  Place 1 spray into both nostrils at bedtime.     QUEtiapine 50 MG tablet  Commonly known as:  SEROQUEL  Take 125 mg (2 1/2 tablets) daily at bedtime; and  May take 25 mg (1/2 tablet) twice a day as needed for anxiety     TESTOSTERONE TD  Place 0.2 mg onto the skin every Monday, Wednesday, and Friday.       zolpidem 5 MG tablet  Commonly known as:  AMBIEN       ROS: Negative, with the exception of above mentioned in HPI   Objective:  BP 97/58 mmHg  Pulse 86  Temp(Src) 98.1 F (36.7 C)  Resp 20  Wt 156 lb (70.761 kg)  SpO2 95% Body mass index is 25.19 kg/(m^2). Gen: Afebrile. No acute distress. Nontoxic in appearance, well-developed, well-nourished, Caucasian female. Very talkative. HENT: AT. [place].  MMM, no oral lesions.  Eyes:Pupils Equal Round Reactive to light, Extraocular movements intact,  Conjunctiva without redness, discharge or icterus. Neck/lymp/endocrine: Supple, no lymphadenopathy CV: RRR  Chest: CTAB, no wheeze or crackles. Good air movement, normal resp effort.  Abd: Soft. NTND. BS present.  Skin: No rashes, purpura or petechiae.  Neuro: Normal gait. PERLA. EOMi. Alert. Oriented x3  Psych: Normal affect, dress and demeanor. Normal speech tone and speed. tangential thought process.  Assessment/Plan: Samantha Lyons is a 45 y.o. female present for acute OV for  Nausea without vomiting Urinary frequency History of Marijuana use - Uncertain etiology. Patient difficult to assess and put together a timeline. Possibly secondary to intermittently taking psychiatric medications, self altering dosages and tapering off medicines without supervision. However she does admit to some urinary frequency. No red flags on exam today, will complete basic lab work to check liver/kidney function, UDS, lithium level and a urinalysis. Patient was cautioned on prior positive THC and its role in nausea. - Patient encouraged to follow-up with psychiatry for proper observation and medications and tapering medications. In the office today, patient was overheard make an appointment for 10/08/2015 to be evaluated by her psychiatric facility concerning her nausea and her increased anxiety. - Urinalysis, Routine w reflex microscopic - CBC w/Diff - Comprehensive metabolic panel - ondansetron  (ZOFRAN-ODT) 4 MG disintegrating tablet; Take 1 tablet (4 mg total) by mouth every 8 (eight) hours as needed for nausea or vomiting.  Dispense: 20 tablet; Refill: 0 - Lithium level - Follow-up depending upon labs   electronically signed by:  Howard Pouch, DO  [place]

## 2015-10-06 NOTE — Patient Instructions (Signed)
Nausea, Adult Nausea is the feeling that you have an upset stomach or have to vomit. Nausea by itself is not likely a serious concern, but it may be an early sign of more serious medical problems. As nausea gets worse, it can lead to vomiting. If vomiting develops, there is the risk of dehydration.  CAUSES   Viral infections.  Food poisoning.  Medicines.  Pregnancy.  Motion sickness.  Migraine headaches.  Emotional distress.  Severe pain from any source.  Alcohol intoxication. HOME CARE INSTRUCTIONS  Get plenty of rest.  Ask your caregiver about specific rehydration instructions.  Eat small amounts of food and sip liquids more often.  Take all medicines as told by your caregiver. SEEK MEDICAL CARE IF:  You have not improved after 2 days, or you get worse.  You have a headache. SEEK IMMEDIATE MEDICAL CARE IF:   You have a fever.  You faint.  You keep vomiting or have blood in your vomit.  You are extremely weak or dehydrated.  You have dark or bloody stools.  You have severe chest or abdominal pain. MAKE SURE YOU:  Understand these instructions.  Will watch your condition.  Will get help right away if you are not doing well or get worse.   This information is not intended to replace advice given to you by your health care provider. Make sure you discuss any questions you have with your health care provider.   Document Released: 07/28/2004 Document Revised: 07/11/2014 Document Reviewed: 03/02/2011 Elsevier Interactive Patient Education 2016 [place] prescribed for nausea. I will call you with labs once results available. This will take a few days to get everything back.  Call your psychiatrist office to discuss the possibility this related to the tapering of your psych medications and increased anxiety. All medications for mental health need to come from you psychiatry provider.

## 2015-10-07 ENCOUNTER — Telehealth: Payer: Self-pay | Admitting: Family Medicine

## 2015-10-07 LAB — LITHIUM LEVEL: Lithium Lvl: 0.1 mEq/L — ABNORMAL LOW (ref 0.80–1.40)

## 2015-10-07 NOTE — Telephone Encounter (Signed)
Please call patient: All of her labs were normal. Her lithium level was low at 0.10.

## 2015-10-08 NOTE — Telephone Encounter (Signed)
Mailbox full unable to leave message.

## 2015-10-08 NOTE — Telephone Encounter (Signed)
Spoke with patient reviewed lab results. 

## 2016-09-02 ENCOUNTER — Telehealth: Payer: Self-pay | Admitting: Family Medicine

## 2016-09-02 DIAGNOSIS — Z9229 Personal history of other drug therapy: Secondary | ICD-10-CM

## 2016-09-02 NOTE — Telephone Encounter (Signed)
We have only seen this patient once on 04/24/15 for establish and once on 10/06/15 for nausea we have not Rx'd these meds and she is requesting OB/GYN referral Please advise

## 2016-09-02 NOTE — Telephone Encounter (Signed)
Left message with information on patient voice mail per DPR 

## 2016-09-02 NOTE — Telephone Encounter (Signed)
Please call pt: - I placed a referral for her for the gyn she had requested.  - I cannot refill the medications she requested. She is est (or was) with [facility] and that is who she needs to get her medications through.  - Her GYN will take care of the hormone replacement therapy she is requesting if they feel appropriate, we have not prescribed those meds for her the past.

## 2016-09-02 NOTE — Telephone Encounter (Signed)
**  Remind patient they can make refill requests via MyChart**  Medication refill request (Name & Dosage): 1.Estradiol-Estriol-Progesterone (BIEST/PROGESTERONE TD)  2.TESTOSTERONE TD  3. buPROPion (WELLBUTRIN XL) 300 MG 24 hr tablet  4. lamoTRIgine (LAMICTAL) 25 MG tablet  5.zolpidem (AMBIEN) 5 MG tablet   Preferred pharmacy (Name & Address): [facility] [address]     Other comments (if applicable): Patient wants to know if she can fill prescriptions that were prescribed by her ob/gyn.   Patient also needs a referral to Dr. Dian Queen [facility] ob/gyn.

## 2016-09-13 NOTE — Telephone Encounter (Signed)
Spoke with patient let her know referral was faxed to the GYN

## 2016-09-13 NOTE — Telephone Encounter (Signed)
Please call patient about a referral. Please contact patient at  [phone]

## 2016-11-08 LAB — HM MAMMOGRAPHY

## 2017-01-13 ENCOUNTER — Ambulatory Visit (INDEPENDENT_AMBULATORY_CARE_PROVIDER_SITE_OTHER): Admitting: Family Medicine

## 2017-01-13 ENCOUNTER — Encounter: Payer: Self-pay | Admitting: Family Medicine

## 2017-01-13 VITALS — BP 109/68 | HR 71 | Temp 98.1°F | Resp 18 | Wt 161.0 lb

## 2017-01-13 DIAGNOSIS — J029 Acute pharyngitis, unspecified: Secondary | ICD-10-CM | POA: Diagnosis not present

## 2017-01-13 DIAGNOSIS — J302 Other seasonal allergic rhinitis: Secondary | ICD-10-CM

## 2017-01-13 LAB — POCT RAPID STREP A (OFFICE): RAPID STREP A SCREEN: NEGATIVE

## 2017-01-13 NOTE — Progress Notes (Signed)
Samantha Lyons , 29-Jan-1971, 46 y.o., female MRN: [id number] Patient Care Team    Relationship Specialty Notifications Start End  Ma Hillock, DO PCP - General Family Medicine  04/27/15     Chief Complaint  Patient presents with  . Sore Throat     Subjective: Pt presents for an OV with complaints of sore throat of 2 days duration.  Associated symptoms include nasal drainage, Mild head congestion. She denies fever, chills, nausea or  vomit. She has tried ibuprofen to help with her symptoms. She does not take a daily antihistamine.  No flowsheet data found.  No Known Allergies Social History  Substance Use Topics  . Smoking status: Current Every Day Smoker    Packs/day: 0.25    Years: 20.00    Types: Cigarettes  . Smokeless tobacco: Never Used  . Alcohol use No     Comment: beer occ   Past Medical History:  Diagnosis Date  . Bipolar disorder, unspecified ([place])   . Blood in stool   . Cholelithiasis   . Chronic fatigue syndrome   . Colitis   . Depression   . Eating disorder   . Endometriosis   . Female hypogonadism   . Generalized anxiety disorder   . GERD (gastroesophageal reflux disease)   . History of chicken pox   . Low testosterone level in female    05/08/2014  . Seasonal allergies   . Tobacco use    Past Surgical History:  Procedure Laterality Date  . ABDOMINAL HYSTERECTOMY  10/23/08  . ABDOMINOPLASTY    . BARTHOLIN GLAND CYST EXCISION    . CESAREAN SECTION    . CHOLECYSTECTOMY N/A 01/27/2014   Procedure: LAPAROSCOPIC CHOLECYSTECTOMY;  Surgeon: [name], MD;  Location: [facility];  Service: General;  Laterality: N/A;   Family History  Problem Relation Age of Onset  . Osteoporosis Mother   . Arthritis Mother        OA  . Stroke Mother   . Hypertension Mother   . Heart disease Father   . Osteoporosis Father   . Alzheimer's disease Father   . Diabetes Brother   . Colon cancer Maternal Grandmother   . Cancer Maternal Grandmother   . Alzheimer's  disease Paternal Grandmother   . Multiple sclerosis Brother   . Stroke Brother    Allergies as of 01/13/2017   No Known Allergies     Medication List       Accurate as of 01/13/17  3:12 PM. Always use your most recent med list.          BIEST/PROGESTERONE TD Place onto the skin 2 (two) times daily. 4 clicks applied to wither forearm daily (Biest 0.28md (8/2)/ Progest 20/39ml   buPROPion 300 MG 24 hr tablet Commonly known as:  WELLBUTRIN XL Take 1 tablet (300 mg total) by mouth daily.   lamoTRIgine 200 MG tablet Commonly known as:  LAMICTAL 200 mg.   TESTOSTERONE TD Place 0.2 mg onto the skin every Monday, Wednesday, and Friday.   zolpidem 5 MG tablet Commonly known as:  AMBIEN       All past medical history, surgical history, allergies, family history, immunizations andmedications were updated in the EMR today and reviewed under the history and medication portions of their EMR.     ROS: Negative, with the exception of above mentioned in HPI   Objective:  BP 109/68 (BP Location: Right Arm, Patient Position: Sitting, Cuff Size: Normal)   Pulse 71  Temp 98.1 F (36.7 C)   Resp 18   Wt 161 lb (73 kg)   SpO2 98%   BMI 25.99 kg/m  Body mass index is 25.99 kg/m. Gen: Afebrile. No acute distress. Nontoxic in appearance, well developed, well nourished.  HENT: AT. [place]. Bilateral TM visualized Without erythema, mild fullness bilaterally. MMM, no oral lesions. Bilateral nares with mild erythema, no swelling, no drainage. Throat without erythema or exudates. No cough, no hoarseness. No tenderness to palpation maxillary sinus. Eyes:Pupils Equal Round Reactive to light, Extraocular movements intact,  Conjunctiva without redness, discharge or icterus. Neck/lymp/endocrine: Supple, no lymphadenopathy CV: RRR Chest: CTAB, no wheeze or crackles. Good air movement, normal resp effort.  Skin: No rashes, purpura or petechiae.  Neuro: Normal gait. PERLA. EOMi. Alert. Oriented x3    No exam data present No results found. Results for orders placed or performed in visit on 01/13/17 (from the past 24 hour(s))  POCT rapid strep A     Status: Normal   Collection Time: 01/13/17  2:34 PM  Result Value Ref Range   Rapid Strep A Screen Negative Negative    Assessment/Plan: Samantha Lyons is a 46 y.o. female present for OV for  Sore throat Seasonal allergic rhinitis, unspecified trigger - Patient signs and symptoms appear to be more allergic than infectious. BI-RADS her to start a daily antihistamine of choice, Flonase and Mucinex (especially before bed). - Rapid strep negative - Follow-up when necessary   Reviewed expectations re: course of current medical issues.  Discussed self-management of symptoms.  Outlined signs and symptoms indicating need for more acute intervention.  Patient verbalized understanding and all questions were answered.  Patient received an After-Visit Summary.    Orders Placed This Encounter  Procedures  . POCT rapid strep A     Note is dictated utilizing voice recognition software. Although note has been proof read prior to signing, occasional typographical errors still can be missed. If any questions arise, please do not hesitate to call for verification.   electronically signed by:  Howard Pouch, DO  [place]

## 2017-01-13 NOTE — Patient Instructions (Signed)
Use a daily antihistamine (allegra or zyrtec).  Mucinex as directed on the package, especially at night.  flonase nasal spray.  Nasal saline to rinse nasal passages.    You appear to have more allergy related symptoms.

## 2017-02-21 ENCOUNTER — Encounter: Payer: Self-pay | Admitting: Family Medicine

## 2017-02-21 ENCOUNTER — Ambulatory Visit (INDEPENDENT_AMBULATORY_CARE_PROVIDER_SITE_OTHER): Admitting: Family Medicine

## 2017-02-21 ENCOUNTER — Ambulatory Visit: Payer: Self-pay | Admitting: Family Medicine

## 2017-02-21 VITALS — BP 116/81 | HR 75 | Temp 98.0°F | Resp 20 | Wt 160.2 lb

## 2017-02-21 DIAGNOSIS — M25561 Pain in right knee: Secondary | ICD-10-CM

## 2017-02-21 MED ORDER — MELOXICAM 15 MG PO TABS: 15.0000 mg | ORAL_TABLET | Freq: Every day | ORAL | 5 refills | Status: DC

## 2017-02-21 NOTE — Patient Instructions (Signed)
I filled out your form for you today.  I believe you can return to work. If pain increases you will need to follow up. You can continue to use heat/ice and naproxen.  Once you are able to wean off naproxen (by about a week), then start the mobic daily with a meal (which is safe to use daily as long as not taking naproxen, motrin, aleve or advil).   Use the brace we talked about during waking hours.

## 2017-02-21 NOTE — Progress Notes (Signed)
Samantha Lyons , 1970-09-17, 46 y.o., female MRN: [id number] Patient Care Team    Relationship Specialty Notifications Start End  Ma Hillock, DO PCP - General Family Medicine  04/27/15     Chief Complaint  Patient presents with  . Knee Pain    right      Subjective: Pt presents for an OV with complaints of Right knee pain that started approximately 02/07/2017. Patient was seen at [facility] on 02/11/2017 for this condition, and diagnosed with a knee sprain. She had an x-ray at that visit which was normal. She has been taking naproxen and icing the area since her urgent care visit. She had purchased a knee sleeve which he has been wearing every day. She was excused from work from the urgent care, but does need a written release to return to work tomorrow as appropriate. She states yesterday the pain had increased, and she took naproxen, today she feels the pain is mildly better. She does feel like she can return to work. This was not a work-related injury. She had stepped and twisted her knee the wrong way. She had injured this knee many years ago.   Urgent care notes and x-ray was reviewed today.  No flowsheet data found.  No Known Allergies Social History  Substance Use Topics  . Smoking status: Current Every Day Smoker    Packs/day: 0.25    Years: 20.00    Types: Cigarettes  . Smokeless tobacco: Never Used  . Alcohol use No     Comment: beer occ   Past Medical History:  Diagnosis Date  . Bipolar disorder, unspecified ([place])   . Blood in stool   . Cholelithiasis   . Chronic fatigue syndrome   . Colitis   . Depression   . Eating disorder   . Endometriosis   . Female hypogonadism   . Generalized anxiety disorder   . GERD (gastroesophageal reflux disease)   . History of chicken pox   . Low testosterone level in female    05/08/2014  . Seasonal allergies   . Tobacco use    Past Surgical History:  Procedure Laterality Date  . ABDOMINAL  HYSTERECTOMY  10/23/08  . ABDOMINOPLASTY    . BARTHOLIN GLAND CYST EXCISION    . CESAREAN SECTION    . CHOLECYSTECTOMY N/A 01/27/2014   Procedure: LAPAROSCOPIC CHOLECYSTECTOMY;  Surgeon: [name], MD;  Location: [facility];  Service: General;  Laterality: N/A;   Family History  Problem Relation Age of Onset  . Osteoporosis Mother   . Arthritis Mother        OA  . Stroke Mother   . Hypertension Mother   . Heart disease Father   . Osteoporosis Father   . Alzheimer's disease Father   . Diabetes Brother   . Colon cancer Maternal Grandmother   . Cancer Maternal Grandmother   . Alzheimer's disease Paternal Grandmother   . Multiple sclerosis Brother   . Stroke Brother    Allergies as of 02/21/2017   No Known Allergies     Medication List       Accurate as of 02/21/17 10:36 AM. Always use your most recent med list.          BIEST/PROGESTERONE TD Place onto the skin 2 (two) times daily. 4 clicks applied to wither forearm daily (Biest 0.78md (8/2)/ Progest 20/63ml   buPROPion 300 MG 24 hr tablet Commonly known as:  WELLBUTRIN XL Take 1 tablet (300  mg total) by mouth daily.   lamoTRIgine 200 MG tablet Commonly known as:  LAMICTAL 200 mg.   naproxen sodium 550 MG tablet Commonly known as:  ANAPROX   TESTOSTERONE TD Place 0.2 mg onto the skin every Monday, Wednesday, and Friday.   zolpidem 5 MG tablet Commonly known as:  AMBIEN       All past medical history, surgical history, allergies, family history, immunizations andmedications were updated in the EMR today and reviewed under the history and medication portions of their EMR.     ROS: Negative, with the exception of above mentioned in HPI   Objective:  BP 116/81 (BP Location: Right Arm, Patient Position: Sitting, Cuff Size: Normal)   Pulse 75   Temp 98 F (36.7 C)   Resp 20   Wt 160 lb 4 oz (72.7 kg)   SpO2 98%   BMI 25.87 kg/m  Body mass index is 25.87 kg/m. Gen: Afebrile. No acute distress. Nontoxic in  appearance, well developed, well nourished.  HENT: AT. [place].  MMM Eyes:Pupils Equal Round Reactive to light, Extraocular movements intact,  Conjunctiva without redness, discharge or icterus. MSK/right knee: No erythema, very mild posterior knee soft tissue swelling. No effusion noted. No tenderness to palpation, with the exception of very mild tenderness posterior knee. No jointline tenderness. Mild crepitus bilateral knees with extension. Full range of motion, with mild discomfort on flexion. Negative Lachman's, no ligament laxity. Negative McMurray's test. Neurovascularly intact distally. Skin: No rashes, purpura or petechiae.  Neuro: Normal gait. PERLA. EOMi. Alert. Oriented x3  Muscle strength 5/5 bilateral lower extremity. DTRs equal bilaterally.   No exam data present No results found. No results found for this or any previous visit (from the past 24 hour(s)).  Assessment/Plan: Samantha Lyons is a 46 y.o. female present for OV for  Acute pain of right knee/injury - Patient appears to be healing well, do not feel she needs any restrictions, and can return to work. Of course using pain as her guide. If worsening pain would need to cut back, be seen again for reevaluation and consider orthopedic referral. - Encouraged her to use a neoprene knee sleeve daily for light compression. Heat/ice therapy discussed. NSAID therapy discussed. Patient was provided with a prescription for meloxicam to use if needed on a daily basis with meals. She is aware not to use other NSAIDs with this medication. Aspercreme topically. - Follow-up when necessary.   Reviewed expectations re: course of current medical issues.  Discussed self-management of symptoms.  Outlined signs and symptoms indicating need for more acute intervention.  Patient verbalized understanding and all questions were answered.  Patient received an After-Visit Summary.    No orders of the defined types were placed in this  encounter.    Note is dictated utilizing voice recognition software. Although note has been proof read prior to signing, occasional typographical errors still can be missed. If any questions arise, please do not hesitate to call for verification.   electronically signed by:  Howard Pouch, DO  [place]

## 2017-04-02 ENCOUNTER — Emergency Department (HOSPITAL_COMMUNITY)

## 2017-04-02 ENCOUNTER — Encounter (HOSPITAL_COMMUNITY): Payer: Self-pay

## 2017-04-02 ENCOUNTER — Telehealth: Payer: Self-pay | Admitting: Gastroenterology

## 2017-04-02 ENCOUNTER — Emergency Department (HOSPITAL_COMMUNITY)
Admission: EM | Admit: 2017-04-02 | Discharge: 2017-04-02 | Disposition: A | Discharge status: Home / Self Care | Attending: Emergency Medicine | Admitting: Emergency Medicine

## 2017-04-02 DIAGNOSIS — Z79899 Other long term (current) drug therapy: Secondary | ICD-10-CM | POA: Diagnosis not present

## 2017-04-02 DIAGNOSIS — F17210 Nicotine dependence, cigarettes, uncomplicated: Secondary | ICD-10-CM | POA: Insufficient documentation

## 2017-04-02 DIAGNOSIS — K529 Noninfective gastroenteritis and colitis, unspecified: Secondary | ICD-10-CM | POA: Diagnosis not present

## 2017-04-02 DIAGNOSIS — K625 Hemorrhage of anus and rectum: Secondary | ICD-10-CM

## 2017-04-02 DIAGNOSIS — R109 Unspecified abdominal pain: Secondary | ICD-10-CM | POA: Diagnosis not present

## 2017-04-02 LAB — PROTIME-INR
INR: 0.92
Prothrombin Time: 12.3 seconds (ref 11.4–15.2)

## 2017-04-02 LAB — COMPREHENSIVE METABOLIC PANEL
ALBUMIN: 4.4 g/dL (ref 3.5–5.0)
ALK PHOS: 71 U/L (ref 38–126)
ALT: 29 U/L (ref 14–54)
ANION GAP: 13 (ref 5–15)
AST: 28 U/L (ref 15–41)
BUN: 11 mg/dL (ref 6–20)
CO2: 26 mmol/L (ref 22–32)
Calcium: 9.5 mg/dL (ref 8.9–10.3)
Chloride: 100 mmol/L — ABNORMAL LOW (ref 101–111)
Creatinine, Ser: 0.92 mg/dL (ref 0.44–1.00)
GFR calc non Af Amer: >60 mL/min (ref >60)
GLUCOSE: 115 mg/dL — AB (ref 65–99)
Potassium: 5.1 mmol/L (ref 3.5–5.1)
SODIUM: 139 mmol/L (ref 135–145)
Total Bilirubin: 0.5 mg/dL (ref 0.3–1.2)
Total Protein: 7.2 g/dL (ref 6.5–8.1)

## 2017-04-02 LAB — CBC WITH DIFFERENTIAL/PLATELET
BASOS ABS: 0 10*3/uL (ref 0.0–0.1)
Basophils Relative: 0 %
EOS PCT: 0 %
Eosinophils Absolute: 0 10*3/uL (ref 0.0–0.7)
HCT: 40.7 % (ref 36.0–46.0)
HEMOGLOBIN: 13.9 g/dL (ref 12.0–15.0)
LYMPHS ABS: 1.4 10*3/uL (ref 0.7–4.0)
LYMPHS PCT: 8 %
MCH: 29.9 pg (ref 26.0–34.0)
MCHC: 34.2 g/dL (ref 30.0–36.0)
MCV: 87.5 fL (ref 78.0–100.0)
Monocytes Absolute: 1 10*3/uL (ref 0.1–1.0)
Monocytes Relative: 6 %
NEUTROS PCT: 86 %
Neutro Abs: 14.1 10*3/uL — ABNORMAL HIGH (ref 1.7–7.7)
PLATELETS: 310 10*3/uL (ref 150–400)
RBC: 4.65 MIL/uL (ref 3.87–5.11)
RDW: 12.4 % (ref 11.5–15.5)
WBC: 16.5 10*3/uL — AB (ref 4.0–10.5)

## 2017-04-02 LAB — TYPE AND SCREEN
ABO/RH(D): A POS
Antibody Screen: NEGATIVE

## 2017-04-02 LAB — LITHIUM LEVEL

## 2017-04-02 MED ORDER — METRONIDAZOLE 500 MG PO TABS: 500.0000 mg | ORAL_TABLET | Freq: Two times a day (BID) | ORAL | 0 refills | Status: DC

## 2017-04-02 MED ORDER — CIPROFLOXACIN HCL 250 MG PO TABS
500.0000 mg | ORAL_TABLET | Freq: Once | ORAL | Status: AC
  Administered 2017-04-02: 500 mg via ORAL
  Filled 2017-04-02: qty 2

## 2017-04-02 MED ORDER — IOPAMIDOL (ISOVUE-300) INJECTION 61%
100.0000 mL | Freq: Once | INTRAVENOUS | Status: AC | PRN
  Administered 2017-04-02: 100 mL via INTRAVENOUS

## 2017-04-02 MED ORDER — METRONIDAZOLE 500 MG PO TABS
500.0000 mg | ORAL_TABLET | Freq: Once | ORAL | Status: AC
  Administered 2017-04-02: 500 mg via ORAL
  Filled 2017-04-02: qty 1

## 2017-04-02 MED ORDER — IBUPROFEN 600 MG PO TABS: 600.0000 mg | ORAL_TABLET | Freq: Four times a day (QID) | ORAL | 0 refills | Status: DC | PRN

## 2017-04-02 MED ORDER — MORPHINE SULFATE (PF) 4 MG/ML IV SOLN
4.0000 mg | Freq: Once | INTRAVENOUS | Status: AC
  Administered 2017-04-02: 4 mg via INTRAVENOUS
  Filled 2017-04-02: qty 1

## 2017-04-02 MED ORDER — FENTANYL CITRATE (PF) 100 MCG/2ML IJ SOLN
50.0000 ug | Freq: Once | INTRAMUSCULAR | Status: AC
  Administered 2017-04-02: 50 ug via INTRAVENOUS
  Filled 2017-04-02: qty 2

## 2017-04-02 MED ORDER — SODIUM CHLORIDE 0.9 % IV BOLUS (SEPSIS)
1000.0000 mL | Freq: Once | INTRAVENOUS | Status: AC
  Administered 2017-04-02: 1000 mL via INTRAVENOUS

## 2017-04-02 MED ORDER — MORPHINE SULFATE (PF) 2 MG/ML IV SOLN
2.0000 mg | Freq: Once | INTRAVENOUS | Status: AC
  Administered 2017-04-02: 2 mg via INTRAVENOUS
  Filled 2017-04-02: qty 1

## 2017-04-02 MED ORDER — ONDANSETRON HCL 4 MG/2ML IJ SOLN
4.0000 mg | Freq: Once | INTRAMUSCULAR | Status: AC
  Administered 2017-04-02: 4 mg via INTRAVENOUS
  Filled 2017-04-02: qty 2

## 2017-04-02 MED ORDER — CIPROFLOXACIN HCL 500 MG PO TABS: 500.0000 mg | ORAL_TABLET | Freq: Two times a day (BID) | ORAL | 0 refills | Status: DC

## 2017-04-02 MED ORDER — HYDROCODONE-ACETAMINOPHEN 5-325 MG PO TABS: 2.0000 | ORAL_TABLET | ORAL | 0 refills | Status: DC | PRN

## 2017-04-02 NOTE — ED Triage Notes (Signed)
Reports of rectal bleeding/ abdominal pain that started yesterday. Reports of blood bright red in color.

## 2017-04-02 NOTE — Discharge Instructions (Signed)
Cipro and flagyl twice daily Hydrocodone for severe pain Drink lots of fluids See Dr. Oneida Alar in the office.  ER for severe or worsening pain / fevers.

## 2017-04-02 NOTE — ED Provider Notes (Signed)
[facility] DEPT Provider Note   CSN: [id number] Arrival date & time: 04/02/17  1211     History   Chief Complaint Chief Complaint  Patient presents with  . Rectal Bleeding    HPI Samantha Lyons is a 46 y.o. female.  HPI  46 year old female, history of bipolar disorder on lithiuma history of hysterectomy and cholecystectomy a few years ago.  She had an episode of colitis several years ago when she was visiting the beach, this resolved but she continued to have some abdominal discomfort for quite some time. She was referred to Drs., never had any definite diagnosis and then became symptom-free for approximately one year. Approximately 3 weeks ago she started to develop some abdominal discomfort, initially this was mild and non-concerning, she was having normal bowel movements until several days ago but reports that over the last 24 hours she has developed multiple episodes of bright red blood per rectum. There is no stool, she has bright red blood which is filling the toilet, today it is less in volume but associated with increased amounts of pain. She is also having some vomiting with this. She denies fevers. Denies any rectal foreign bodies. Denies any recent travel or antibiotic use.  Denies use of nsaids / anticoag / asa and denies colonoscopy.  Past Medical History:  Diagnosis Date  . Bipolar disorder, unspecified ([place])   . Blood in stool   . Cholelithiasis   . Chronic fatigue syndrome   . Colitis   . Depression   . Eating disorder   . Endometriosis   . Female hypogonadism   . Generalized anxiety disorder   . GERD (gastroesophageal reflux disease)   . History of chicken pox   . Low testosterone level in female    05/08/2014  . Seasonal allergies   . Tobacco use     Patient Active Problem List   Diagnosis Date Noted  . Nausea without vomiting 10/06/2015  . Marijuana use 10/06/2015  . Hypertriglyceridemia 05/12/2015  . Vitamin D deficiency 05/12/2015  . Bipolar  disorder, current episode mixed, moderate ([place])   . Bipolar disorder ([place]) 04/26/2015  . Encounter to establish care 04/24/2015  . Abdominal pain 04/24/2015  . Fatigue 04/24/2015  . Diarrhea 04/24/2015  . Fecal incontinence 04/24/2015  . Healthcare maintenance 04/24/2015  . Anxiety   . Depression   . Endometriosis   . Tobacco use     Past Surgical History:  Procedure Laterality Date  . ABDOMINAL HYSTERECTOMY  10/23/08  . ABDOMINOPLASTY    . BARTHOLIN GLAND CYST EXCISION    . CESAREAN SECTION    . CHOLECYSTECTOMY N/A 01/27/2014   Procedure: LAPAROSCOPIC CHOLECYSTECTOMY;  Surgeon: [name], MD;  Location: [facility];  Service: General;  Laterality: N/A;    OB History    Gravida Para Term Preterm AB Living   1 1       2    SAB TAB Ectopic Multiple Live Births         1        Obstetric Comments   Has 3 stepchildren       Home Medications    Prior to Admission medications   Medication Sig Start Date End Date Taking? Authorizing Provider  buPROPion (WELLBUTRIN XL) 300 MG 24 hr tablet Take 1 tablet (300 mg total) by mouth daily. Patient taking differently: Take 300 mg by mouth 2 (two) times daily. 600mg  in AM, 300mg  in PM 04/29/15  Yes Derrill Center, NP  clonazePAM (  KLONOPIN) 1 MG tablet Take 1 tablet by mouth 2 (two) times daily as needed for anxiety. 03/29/17  Yes [provider]  cloNIDine (CATAPRES) 0.1 MG tablet Take 1 tablet by mouth 2 (two) times daily as needed (BP).  03/29/17  Yes [provider]  Estradiol-Estriol-Progesterone (BIEST/PROGESTERONE TD) Place onto the skin 2 (two) times daily. 4 clicks applied to wither forearm daily (Biest 0.41md (8/2)/ Progest 20/65ml   Yes [provider]  lamoTRIgine (LAMICTAL) 200 MG tablet Take 200 mg by mouth daily.  01/01/17  Yes [provider]  TESTOSTERONE TD Place 0.2 mg onto the skin every Monday, Wednesday, and Friday.    Yes [provider]  zolpidem (AMBIEN) 5 MG tablet Take 5 mg  by mouth at bedtime.  09/30/15  Yes [provider]  ciprofloxacin (CIPRO) 500 MG tablet Take 1 tablet (500 mg total) by mouth every 12 (twelve) hours. 04/02/17   Noemi Chapel, MD  HYDROcodone-acetaminophen (NORCO/VICODIN) 5-325 MG tablet Take 2 tablets by mouth every 4 (four) hours as needed. 04/02/17   Noemi Chapel, MD  ibuprofen (ADVIL,MOTRIN) 600 MG tablet Take 1 tablet (600 mg total) by mouth every 6 (six) hours as needed. 04/02/17   Noemi Chapel, MD  metroNIDAZOLE (FLAGYL) 500 MG tablet Take 1 tablet (500 mg total) by mouth 2 (two) times daily. 04/02/17   Noemi Chapel, MD    Family History Family History  Problem Relation Age of Onset  . Osteoporosis Mother   . Arthritis Mother        OA  . Stroke Mother   . Hypertension Mother   . Heart disease Father   . Osteoporosis Father   . Alzheimer's disease Father   . Diabetes Brother   . Colon cancer Maternal Grandmother   . Cancer Maternal Grandmother   . Alzheimer's disease Paternal Grandmother   . Multiple sclerosis Brother   . Stroke Brother     Social History Social History  Substance Use Topics  . Smoking status: Current Every Day Smoker    Packs/day: 0.25    Years: 20.00    Types: Cigarettes  . Smokeless tobacco: Never Used  . Alcohol use No     Comment: beer occ     Allergies   Patient has no known allergies.   Review of Systems Review of Systems  All other systems reviewed and are negative.    Physical Exam Updated Vital Signs BP 116/82   Pulse 79   Temp 98 F (36.7 C) (Oral)   Resp 15   Ht 5\' 5"  (1.651 m)   Wt 65.8 kg (145 lb)   SpO2 97%   BMI 24.13 kg/m   Physical Exam  Constitutional: She appears well-developed and well-nourished. No distress.  HENT:  Head: Normocephalic and atraumatic.  Mouth/Throat: Oropharynx is clear and moist. No oropharyngeal exudate.  Eyes: Pupils are equal, round, and reactive to light. Conjunctivae and EOM are normal. Right eye exhibits no discharge. Left  eye exhibits no discharge. No scleral icterus.  Neck: Normal range of motion. Neck supple. No JVD present. No thyromegaly present.  Cardiovascular: Normal rate, regular rhythm, normal heart sounds and intact distal pulses.  Exam reveals no gallop and no friction rub.   No murmur heard. Pulmonary/Chest: Effort normal and breath sounds normal. No respiratory distress. She has no wheezes. She has no rales.  Abdominal: Soft. Bowel sounds are normal. She exhibits no distension and no mass. There is tenderness ( mild diffuse ttp, no guarding).  Genitourinary:  Genitourinary Comments: Chaperone present for exam, BRBPR found, no fissues or hemorrhoids  Musculoskeletal: Normal range of motion. She exhibits no edema or tenderness.  Lymphadenopathy:    She has no cervical adenopathy.  Neurological: She is alert. Coordination normal.  Skin: Skin is warm and dry. No rash noted. No erythema.  Psychiatric: She has a normal mood and affect. Her behavior is normal.  Nursing note and vitals reviewed.    ED Treatments / Results  Labs (all labs ordered are listed, but only abnormal results are displayed) Labs Reviewed  CBC WITH DIFFERENTIAL/PLATELET - Abnormal; Notable for the following:       Result Value   WBC 16.5 (*)    Neutro Abs 14.1 (*)    All other components within normal limits  COMPREHENSIVE METABOLIC PANEL - Abnormal; Notable for the following:    Chloride 100 (*)    Glucose, Bld 115 (*)    All other components within normal limits  LITHIUM LEVEL - Abnormal; Notable for the following:    Lithium Lvl <0.06 (*)    All other components within normal limits  PROTIME-INR  TYPE AND SCREEN     Radiology Ct Abdomen Pelvis W Contrast  Addendum Date: 04/02/2017   ADDENDUM REPORT: 04/02/2017 15:16 ADDENDUM: There has been a mild increase in size of the previously demonstrated left adrenal mass, compatible with a slowly enlarging adenoma. Electronically Signed   By: Claudie Revering M.D.   On:  04/02/2017 15:16   Result Date: 04/02/2017 CLINICAL DATA:  Diffuse abdominal pain with nausea, vomiting and rectal bleeding for the past 2 days. EXAM: CT ABDOMEN AND PELVIS WITH CONTRAST TECHNIQUE: Multidetector CT imaging of the abdomen and pelvis was performed using the standard protocol following bolus administration of intravenous contrast. CONTRAST:  170mL ISOVUE-300 IOPAMIDOL (ISOVUE-300) INJECTION 61% COMPARISON:  Chest, abdomen and pelvis radiographs dated 01/24/2014. Right upper quadrant abdomen ultrasound dated 01/24/2014. Abdomen pelvis CT dated 01/22/2014. FINDINGS: Lower chest: Interval small amount of linear atelectasis or scarring at both lung bases. Hepatobiliary: Cholecystectomy clips.  Unremarkable liver. Pancreas: Unremarkable. No pancreatic ductal dilatation or surrounding inflammatory changes. Spleen: Normal in size without focal abnormality. Adrenals/Urinary Tract: The previously demonstrated 1.6 x 1.1 cm left adrenal mass currently measures 2.3 x 1.5 cm. Normal appearing right adrenal gland. Tiny bilateral renal cysts. Unremarkable urinary bladder and ureters. No urinary tract calculi or hydronephrosis. Stomach/Bowel: Diffuse low-density wall thickening involving the distal transverse colon, splenic flexure, descending colon and proximal sigmoid colon with pericolonic soft tissue stranding. A single diverticulum is arising from the proximal sigmoid colon in this area. The pericolonic soft tissue stranding is more pronounced more proximally. The third portion of the duodenum is dilated, increased since the previous examination. The remainder of the small bowel has a normal appearance, as does the appendix. Vascular/Lymphatic: No significant vascular findings are present. No vascular occlusions are seen. No enlarged abdominal or pelvic lymph nodes. Reproductive: Status post hysterectomy. No adnexal masses. Other: Tiny umbilical hernia containing fat. No free peritoneal fluid or air.  Musculoskeletal: Lower lumbar spine degenerative changes. IMPRESSION: 1. Acute colitis involving the distal transverse colon, splenic flexure, descending colon and proximal sigmoid colon. This is most likely infectious or inflammatory in nature. There are no CT findings to indicate ischemic colitis. 2. Functional dilatation of the third portion of the duodenum. 3. Single proximal sigmoid colon diverticulum. This is not at the location of the majority of the pericolonic soft tissue stranding. Electronically Signed: By: Claudie Revering  M.D. On: 04/02/2017 15:07    Procedures Procedures (including critical care time)  Medications Ordered in ED Medications  sodium chloride 0.9 % bolus 1,000 mL (0 mLs Intravenous Stopped 04/02/17 1554)  fentaNYL (SUBLIMAZE) injection 50 mcg (50 mcg Intravenous Given 04/02/17 1339)  morphine 4 MG/ML injection 4 mg (4 mg Intravenous Given 04/02/17 1328)  ondansetron (ZOFRAN) injection 4 mg (4 mg Intravenous Given 04/02/17 1328)  iopamidol (ISOVUE-300) 61 % injection 100 mL (100 mLs Intravenous Contrast Given 04/02/17 1416)  morphine 2 MG/ML injection 2 mg (2 mg Intravenous Given 04/02/17 1558)  ciprofloxacin (CIPRO) tablet 500 mg (500 mg Oral Given 04/02/17 1604)  metroNIDAZOLE (FLAGYL) tablet 500 mg (500 mg Oral Given 04/02/17 1604)     Initial Impression / Assessment and Plan / ED Course  I have reviewed the triage vital signs and the nursing notes.  Pertinent labs & imaging results that were available during my care of the patient were reviewed by me and considered in my medical decision making (see chart for details).     Does not appear anemic Has likely colitis - consider IBD as well as infectious colitis. Fluids, Zofran, Morphine Labs and CT scan  CT confirms colitis D/w Dr. Oneida Alar - will see in the office Pt agreeable Feels better after meds No anemia, leukocytosis present  Final Clinical Impressions(s) / ED Diagnoses   Final diagnoses:  Colitis    Rectal bleeding    New Prescriptions New Prescriptions   CIPROFLOXACIN (CIPRO) 500 MG TABLET    Take 1 tablet (500 mg total) by mouth every 12 (twelve) hours.   HYDROCODONE-ACETAMINOPHEN (NORCO/VICODIN) 5-325 MG TABLET    Take 2 tablets by mouth every 4 (four) hours as needed.   IBUPROFEN (ADVIL,MOTRIN) 600 MG TABLET    Take 1 tablet (600 mg total) by mouth every 6 (six) hours as needed.   METRONIDAZOLE (FLAGYL) 500 MG TABLET    Take 1 tablet (500 mg total) by mouth 2 (two) times daily.     Noemi Chapel, MD 04/02/17 1630

## 2017-04-02 NOTE — Telephone Encounter (Signed)
CONTACTED BY ED. PT NEED NPV WITHIN 10-14 DAYS, Dx: BRBPR/ACUTE COLITIS.

## 2017-04-03 NOTE — Telephone Encounter (Signed)
PATIENT SCHEDULED  °

## 2017-04-07 ENCOUNTER — Other Ambulatory Visit: Payer: Self-pay

## 2017-04-07 ENCOUNTER — Encounter: Payer: Self-pay | Admitting: Nurse Practitioner

## 2017-04-07 ENCOUNTER — Ambulatory Visit (INDEPENDENT_AMBULATORY_CARE_PROVIDER_SITE_OTHER): Admitting: Nurse Practitioner

## 2017-04-07 DIAGNOSIS — K625 Hemorrhage of anus and rectum: Secondary | ICD-10-CM | POA: Insufficient documentation

## 2017-04-07 DIAGNOSIS — K529 Noninfective gastroenteritis and colitis, unspecified: Secondary | ICD-10-CM

## 2017-04-07 MED ORDER — PEG 3350-KCL-NA BICARB-NACL 420 G PO SOLR: 4000.0000 mL | ORAL | 0 refills | Status: DC

## 2017-04-07 MED ORDER — PROMETHAZINE HCL 12.5 MG PO TABS: 12.5000 mg | ORAL_TABLET | Freq: Three times a day (TID) | ORAL | 1 refills | Status: DC | PRN

## 2017-04-07 NOTE — Patient Instructions (Signed)
1. Have your labs completed when you're able to. 2. Continue taking her antibiotics to completion. 3. I sent in medication for your nausea. 4. We will schedule your procedure for you. We will likely need to wait at least 4 weeks to make sure that any possible infection has resolved. 5. Return for follow-up in 3-4 months.

## 2017-04-07 NOTE — Assessment & Plan Note (Signed)
The patient had a remote history of colitis a few years ago which eventually resolved despite lingering abdominal discomfort. She was asymptomatic for about a year and then began having worsening symptoms including diarrhea and then, 24 hours before ER presentation, significant rectal bleeding. She presented GERD and was diagnosed with colitis infectious versus inflammatory on CT imaging. Her white blood cell count was in the 16 range. She was started on antibiotics as an outpatient. She has not completed these. She continues to have rectal bleeding with essentially every bowel movement deemed "moderate" in amount. Persistent nausea as well. At this point recommend continuing antibiotics. I will send the Phenergan for nausea. I will check labs including CBC, sedimentation rate, CRP. This will allow evaluation for significant blood loss as well as possible positive inflammatory markers to help distinguish between inflammatory versus infectious etiology. We will plan for colonoscopy on propofol/MAC in about 4 weeks to allow time for healing if she does have infectious colitis. Return for follow-up in 3-4 months. We may need to bring her back sooner or order additional labs based on her results.  Proceed with colonoscopy on propofol/MAC with Dr. Oneida Alar in the near future. The risks, benefits, and alternatives have been discussed in detail with the patient. They state understanding and desire to proceed.   The patient is currently on Klonopin, Wellbutrin, hydrocodone, Ambien. She uses marijuana daily. No other anticoagulants, anxiolytics, chronic pain medications, or antidepressants. We will plan for propofol/MAC to promote adequate sedation.

## 2017-04-07 NOTE — Progress Notes (Addendum)
REVIEWED-NO ADDITIONAL RECOMMENDATIONS.   Primary Care Physician:  Ma Hillock, DO Primary Gastroenterologist:  Dr. Oneida Alar  Chief Complaint  Patient presents with  . colitis  . Abdominal Pain  . Rectal Bleeding    HPI:   Samantha Lyons is a 46 y.o. female who presents For emergency department follow-up. The patient was seen in the ER 04/02/2017. History of colitis several years ago which resolved and had residual abdominal discomfort for some time. Never definitively diagnosed and has been asymptomatic for about one year. 3 weeks prior to ER visit she began having abdominal discomfort and then several days ago had a change in bowel habits to diarrhea and was in the previous 24 hours began having multiple episodes of bright red blood per rectum which is filling the toilet. Decreased in volume date of ER presentation but associated with increasing amounts of pain. Also with some vomiting but no fevers. Denies NSAIDs, anticoagulants, aspirin. No previous colonoscopy. Physical examination emergency room revealed bright red blood per rectum without fissures or hemorrhoids. White blood cell count elevated at 16.5. Other labs including remaining CBC, CMP, lithium, PTT/INR was normal.  Overall ED impression likely colitis consider IBD as well as infectious. She was given fluids, Zofran, morphine. CT scan was ordered. CT indicated acute colitis involving the distal transverse colon, splenic flexure, descending colon, proximal sigmoid colon most likely infectious or inflammatory. No CT findings indicating ischemic colitis. Single functional colonic diverticulum not in the area of concern. Addendum to the CT notes mild increase in size of previously demonstrated left adrenal mass compatible with a slowly enlarging adenoma.  We will consult it initially by phone and recommended outpatient visit within 10-14 days.  Today she states she's doing ok overall. She has fatigue, persistent abdominal pain. She  was given antibiotics which she has been taking and should have about 4 days left. Some minimal (if any) improvement with antibiotics. Continues with hematochezia with every bowel movement, described as "moderate" in amount. Denies melena. Denies fever, chills, unintentional weight loss, rashes. Fatigue has been chronic, now worse with symptoms. Scared to eat. Still with diarrhea, loose but not watery. Has intermittent chest pain and dyspnea which she states is due to anxiety. Has some intermittent dizziness. No syncope. Still some nausea and vomiting. Denies hematemesis. Denies any other upper or lower GI symptoms.  No previous colonoscopy. Takes Ibuprofen if absolutely needed about once a week. No ASA powders.  Past Medical History:  Diagnosis Date  . Bipolar disorder, unspecified ([place])   . Blood in stool   . Cholelithiasis   . Chronic fatigue syndrome   . Colitis   . Depression   . Eating disorder   . Endometriosis   . Female hypogonadism   . Generalized anxiety disorder   . GERD (gastroesophageal reflux disease)   . History of chicken pox   . Low testosterone level in female    05/08/2014  . Seasonal allergies   . Tobacco use     Past Surgical History:  Procedure Laterality Date  . ABDOMINAL HYSTERECTOMY  10/23/08  . ABDOMINOPLASTY    . BARTHOLIN GLAND CYST EXCISION    . CESAREAN SECTION    . CHOLECYSTECTOMY N/A 01/27/2014   Procedure: LAPAROSCOPIC CHOLECYSTECTOMY;  Surgeon: [name], MD;  Location: [facility];  Service: General;  Laterality: N/A;    Current Outpatient Prescriptions  Medication Sig Dispense Refill  . buPROPion (WELLBUTRIN SR) 150 MG 12 hr tablet Take 150 mg by mouth 2 (  two) times daily. Takes 2 in the morning and one in the afternoon    . ciprofloxacin (CIPRO) 500 MG tablet Take 1 tablet (500 mg total) by mouth every 12 (twelve) hours. 20 tablet 0  . clonazePAM (KLONOPIN) 1 MG tablet Take 1 tablet by mouth 2 (two) times daily as needed for anxiety.    .  cloNIDine (CATAPRES) 0.1 MG tablet Take 1 tablet by mouth 2 (two) times daily as needed (BP).     . Estradiol-Estriol-Progesterone (BIEST/PROGESTERONE TD) Place onto the skin 2 (two) times daily. 4 clicks applied to wither forearm daily (Biest 0.63md (8/2)/ Progest 20/17ml    . HYDROcodone-acetaminophen (NORCO/VICODIN) 5-325 MG tablet Take 2 tablets by mouth every 4 (four) hours as needed. 10 tablet 0  . ibuprofen (ADVIL,MOTRIN) 600 MG tablet Take 1 tablet (600 mg total) by mouth every 6 (six) hours as needed. 30 tablet 0  . lamoTRIgine (LAMICTAL) 200 MG tablet Take 200 mg by mouth daily.     . metroNIDAZOLE (FLAGYL) 500 MG tablet Take 1 tablet (500 mg total) by mouth 2 (two) times daily. 20 tablet 0  . TESTOSTERONE TD Place 0.2 mg onto the skin every Monday, Wednesday, and Friday.     . zolpidem (AMBIEN) 5 MG tablet Take 5 mg by mouth at bedtime.      No current facility-administered medications for this visit.     Allergies as of 04/07/2017  . (No Known Allergies)    Family History  Problem Relation Age of Onset  . Osteoporosis Mother   . Arthritis Mother        OA  . Stroke Mother   . Hypertension Mother   . Heart disease Father   . Osteoporosis Father   . Alzheimer's disease Father   . Diabetes Brother   . Colon cancer Maternal Grandmother   . Cancer Maternal Grandmother   . Alzheimer's disease Paternal Grandmother   . Multiple sclerosis Brother   . Stroke Brother   . Inflammatory bowel disease Neg Hx   . Crohn's disease Neg Hx     Social History   Social History  . Marital status: Married    Spouse name: N/A  . Number of children: N/A  . Years of education: N/A   Occupational History  . Not on file.   Social History Main Topics  . Smoking status: Current Every Day Smoker    Packs/day: 0.25    Years: 20.00    Types: Cigarettes  . Smokeless tobacco: Never Used  . Alcohol use No     Comment: beer occ  . Drug use: Yes    Frequency: 7.0 times per week    Types:  Marijuana     Comment: started with abdominal pain  . Sexual activity: Yes    Birth control/ protection: Surgical, None   Other Topics Concern  . Not on file   Social History Narrative   Married. High school grad.   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    Smoker. MJ use.  Herbal remedies, seat belts, wears a bike helmet. Exercises more than 3 times a week.    SMoke alarm in the home.    Guns in the home (in a locked cabinet)    Review of Systems: Complete ROS negative except as per HPI.    Physical Exam: BP 99/67   Pulse 78   Temp 97.8 F (36.6 C) (Oral)   Ht 5\' 5"  (1.651 m)   Wt 156  lb (70.8 kg)   BMI 25.96 kg/m  General:   Alert and oriented. Pleasant and cooperative. Well-nourished and well-developed.  Head:  Normocephalic and atraumatic. Eyes:  Without icterus, sclera clear and conjunctiva pink.  Ears:  Normal auditory acuity. Cardiovascular:  S1, S2 present without murmurs appreciated. Extremities without clubbing or edema. Respiratory:  Clear to auscultation bilaterally. No wheezes, rales, or rhonchi. No distress.  Gastrointestinal:  +BS, soft, and non-distended. Left-sided abdominal tenderness. No HSM noted. No guarding or rebound. No masses appreciated.  Rectal:  Deferred  Musculoskalatal:  Symmetrical without gross deformities. Neurologic:  Alert and oriented x4;  grossly normal neurologically. Psych:  Alert and cooperative. Normal mood and affect. Heme/Lymph/Immune: No excessive bruising noted.    04/07/2017 11:42 AM   Disclaimer: This note was dictated with voice recognition software. Similar sounding words can inadvertently be transcribed and may not be corrected upon review.

## 2017-04-07 NOTE — Assessment & Plan Note (Signed)
Rectal bleeding in addition to her abdominal pain and nausea. CBC to check status of her hemoglobin. Infectious versus inflammatory colitis on CT imaging. Further evaluation and recommendations as per below. Return for follow-up in 3-4 months.

## 2017-04-08 LAB — CBC WITH DIFFERENTIAL/PLATELET
BASOS ABS: 41 {cells}/uL (ref 0–200)
Basophils Relative: 0.6 %
EOS ABS: 214 {cells}/uL (ref 15–500)
Eosinophils Relative: 3.1 %
HEMATOCRIT: 36.4 % (ref 35.0–45.0)
HEMOGLOBIN: 12.9 g/dL (ref 11.7–15.5)
LYMPHS ABS: 2146 {cells}/uL (ref 850–3900)
MCH: 30.2 pg (ref 27.0–33.0)
MCHC: 35.4 g/dL (ref 32.0–36.0)
MCV: 85.2 fL (ref 80.0–100.0)
MPV: 9.5 fL (ref 7.5–12.5)
Monocytes Relative: 5 %
NEUTROS PCT: 60.2 %
Neutro Abs: 4154 cells/uL (ref 1500–7800)
Platelets: 335 10*3/uL (ref 140–400)
RBC: 4.27 10*6/uL (ref 3.80–5.10)
RDW: 12.5 % (ref 11.0–15.0)
Total Lymphocyte: 31.1 %
WBC: 6.9 10*3/uL (ref 3.8–10.8)
WBCMIX: 345 {cells}/uL (ref 200–950)

## 2017-04-08 LAB — SEDIMENTATION RATE: Sed Rate: 2 mm/h (ref 0–20)

## 2017-04-08 LAB — C-REACTIVE PROTEIN: CRP: 3.2 mg/L (ref <8.0)

## 2017-04-10 ENCOUNTER — Encounter: Payer: Self-pay | Admitting: Gastroenterology

## 2017-04-10 ENCOUNTER — Other Ambulatory Visit: Payer: Self-pay

## 2017-04-10 ENCOUNTER — Telehealth: Payer: Self-pay

## 2017-04-10 DIAGNOSIS — K529 Noninfective gastroenteritis and colitis, unspecified: Secondary | ICD-10-CM

## 2017-04-10 DIAGNOSIS — K625 Hemorrhage of anus and rectum: Secondary | ICD-10-CM

## 2017-04-10 NOTE — Telephone Encounter (Signed)
Tried to call pt to inform of pre-op appt 05/03/17 at 11:00am, no answer, LMOVM. Letter mailed.

## 2017-04-11 NOTE — Progress Notes (Signed)
CC'ED TO PCP 

## 2017-05-01 ENCOUNTER — Encounter: Payer: Self-pay | Admitting: General Practice

## 2017-05-01 NOTE — Progress Notes (Signed)
Letter with results mailed to the patient

## 2017-05-01 NOTE — Patient Instructions (Signed)
Samantha Lyons  05/01/2017     @PREFPERIOPPHARMACY @   Your procedure is scheduled on  05/09/2017   Report to [facility] at  27  A.M.  Call this number if you have problems the morning of surgery:  [phone]   Remember:  Do not eat food or drink liquids after midnight.  Take these medicines the morning of surgery with A SIP OF WATER  Wellbutrin, klonopin, catapress, hydrocodone, phenergan.   Do not wear jewelry, make-up or nail polish.  Do not wear lotions, powders, or perfumes, or deoderant.  Do not shave 48 hours prior to surgery.  Men may shave face and neck.  Do not bring valuables to the hospital.  [facility] is not responsible for any belongings or valuables.  Contacts, dentures or bridgework may not be worn into surgery.  Leave your suitcase in the car.  After surgery it may be brought to your room.  For patients admitted to the hospital, discharge time will be determined by your treatment team.  Patients discharged the day of surgery will not be allowed to drive home.   Name and phone number of your driver:    Family   Special instructions:  Follow the diet and prep instructions given to you by Dr Nona Dell office.  Please read over the following fact sheets that you were given. Anesthesia Post-op Instructions and Care and Recovery After Surgery       Colonoscopy, Adult A colonoscopy is an exam to look at the entire large intestine. During the exam, a lubricated, bendable tube is inserted into the anus and then passed into the rectum, colon, and other parts of the large intestine. A colonoscopy is often done as a part of normal colorectal screening or in response to certain symptoms, such as anemia, persistent diarrhea, abdominal pain, and blood in the stool. The exam can help screen for and diagnose medical problems, including:  Tumors.  Polyps.  Inflammation.  Areas of bleeding.  Tell a health care provider about:  Any allergies you  have.  All medicines you are taking, including vitamins, herbs, eye drops, creams, and over-the-counter medicines.  Any problems you or family members have had with anesthetic medicines.  Any blood disorders you have.  Any surgeries you have had.  Any medical conditions you have.  Any problems you have had passing stool. What are the risks? Generally, this is a safe procedure. However, problems may occur, including:  Bleeding.  A tear in the intestine.  A reaction to medicines given during the exam.  Infection (rare).  What happens before the procedure? Eating and drinking restrictions Follow instructions from your health care provider about eating and drinking, which may include:  A few days before the procedure - follow a low-fiber diet. Avoid nuts, seeds, dried fruit, raw fruits, and vegetables.  1-3 days before the procedure - follow a clear liquid diet. Drink only clear liquids, such as clear broth or bouillon, black coffee or tea, clear juice, clear soft drinks or sports drinks, gelatin dessert, and popsicles. Avoid any liquids that contain red or purple dye.  On the day of the procedure - do not eat or drink anything during the 2 hours before the procedure, or within the time period that your health care provider recommends.  Bowel prep If you were prescribed an oral bowel prep to clean out your colon:  Take it as told by your health care provider. Starting  the day before your procedure, you will need to drink a large amount of medicated liquid. The liquid will cause you to have multiple loose stools until your stool is almost clear or light green.  If your skin or anus gets irritated from diarrhea, you may use these to relieve the irritation: ? Medicated wipes, such as adult wet wipes with aloe and vitamin E. ? A skin soothing-product like petroleum jelly.  If you vomit while drinking the bowel prep, take a break for up to 60 minutes and then begin the bowel prep  again. If vomiting continues and you cannot take the bowel prep without vomiting, call your health care provider.  General instructions  Ask your health care provider about changing or stopping your regular medicines. This is especially important if you are taking diabetes medicines or blood thinners.  Plan to have someone take you home from the hospital or clinic. What happens during the procedure?  An IV tube may be inserted into one of your veins.  You will be given medicine to help you relax (sedative).  To reduce your risk of infection: ? Your health care team will wash or sanitize their hands. ? Your anal area will be washed with soap.  You will be asked to lie on your side with your knees bent.  Your health care provider will lubricate a long, thin, flexible tube. The tube will have a camera and a light on the end.  The tube will be inserted into your anus.  The tube will be gently eased through your rectum and colon.  Air will be delivered into your colon to keep it open. You may feel some pressure or cramping.  The camera will be used to take images during the procedure.  A small tissue sample may be removed from your body to be examined under a microscope (biopsy). If any potential problems are found, the tissue will be sent to a lab for testing.  If small polyps are found, your health care provider may remove them and have them checked for cancer cells.  The tube that was inserted into your anus will be slowly removed. The procedure may vary among health care providers and hospitals. What happens after the procedure?  Your blood pressure, heart rate, breathing rate, and blood oxygen level will be monitored until the medicines you were given have worn off.  Do not drive for 24 hours after the exam.  You may have a small amount of blood in your stool.  You may pass gas and have mild abdominal cramping or bloating due to the air that was used to inflate your colon  during the exam.  It is up to you to get the results of your procedure. Ask your health care provider, or the department performing the procedure, when your results will be ready. This information is not intended to replace advice given to you by your health care provider. Make sure you discuss any questions you have with your health care provider. Document Released: 06/17/2000 Document Revised: 04/20/2016 Document Reviewed: 09/01/2015 Elsevier Interactive Patient Education  2018 [employer].  Colonoscopy, Adult, Care After This sheet gives you information about how to care for yourself after your procedure. Your health care provider may also give you more specific instructions. If you have problems or questions, contact your health care provider. What can I expect after the procedure? After the procedure, it is common to have:  A small amount of blood in your stool for 24  hours after the procedure.  Some gas.  Mild abdominal cramping or bloating.  Follow these instructions at home: General instructions   For the first 24 hours after the procedure: ? Do not drive or use machinery. ? Do not sign important documents. ? Do not drink alcohol. ? Do your regular daily activities at a slower pace than normal. ? Eat soft, easy-to-digest foods. ? Rest often.  Take over-the-counter or prescription medicines only as told by your health care provider.  It is up to you to get the results of your procedure. Ask your health care provider, or the department performing the procedure, when your results will be ready. Relieving cramping and bloating  Try walking around when you have cramps or feel bloated.  Apply heat to your abdomen as told by your health care provider. Use a heat source that your health care provider recommends, such as a moist heat pack or a heating pad. ? Place a towel between your skin and the heat source. ? Leave the heat on for 20-30 minutes. ? Remove the heat if your  skin turns bright red. This is especially important if you are unable to feel pain, heat, or cold. You may have a greater risk of getting burned. Eating and drinking  Drink enough fluid to keep your urine clear or pale yellow.  Resume your normal diet as instructed by your health care provider. Avoid heavy or fried foods that are hard to digest.  Avoid drinking alcohol for as long as instructed by your health care provider. Contact a health care provider if:  You have blood in your stool 2-3 days after the procedure. Get help right away if:  You have more than a small spotting of blood in your stool.  You pass large blood clots in your stool.  Your abdomen is swollen.  You have nausea or vomiting.  You have a fever.  You have increasing abdominal pain that is not relieved with medicine. This information is not intended to replace advice given to you by your health care provider. Make sure you discuss any questions you have with your health care provider. Document Released: 02/02/2004 Document Revised: 03/14/2016 Document Reviewed: 09/01/2015 Elsevier Interactive Patient Education  2018 [employer] Anesthesia is a term that refers to techniques, procedures, and medicines that help a person stay safe and comfortable during a medical procedure. Monitored anesthesia care, or sedation, is one type of anesthesia. Your anesthesia specialist may recommend sedation if you will be having a procedure that does not require you to be unconscious, such as:  Cataract surgery.  A dental procedure.  A biopsy.  A colonoscopy.  During the procedure, you may receive a medicine to help you relax (sedative). There are three levels of sedation:  Mild sedation. At this level, you may feel awake and relaxed. You will be able to follow directions.  Moderate sedation. At this level, you will be sleepy. You may not remember the procedure.  Deep sedation. At this level,  you will be asleep. You will not remember the procedure.  The more medicine you are given, the deeper your level of sedation will be. Depending on how you respond to the procedure, the anesthesia specialist may change your level of sedation or the type of anesthesia to fit your needs. An anesthesia specialist will monitor you closely during the procedure. Let your health care provider know about:  Any allergies you have.  All medicines you are taking, including vitamins,  herbs, eye drops, creams, and over-the-counter medicines.  Any use of steroids (by mouth or as a cream).  Any problems you or family members have had with sedatives and anesthetic medicines.  Any blood disorders you have.  Any surgeries you have had.  Any medical conditions you have, such as sleep apnea.  Whether you are pregnant or may be pregnant.  Any use of cigarettes, alcohol, or street drugs. What are the risks? Generally, this is a safe procedure. However, problems may occur, including:  Getting too much medicine (oversedation).  Nausea.  Allergic reaction to medicines.  Trouble breathing. If this happens, a breathing tube may be used to help with breathing. It will be removed when you are awake and breathing on your own.  Heart trouble.  Lung trouble.  Before the procedure Staying hydrated Follow instructions from your health care provider about hydration, which may include:  Up to 2 hours before the procedure - you may continue to drink clear liquids, such as water, clear fruit juice, black coffee, and plain tea.  Eating and drinking restrictions Follow instructions from your health care provider about eating and drinking, which may include:  8 hours before the procedure - stop eating heavy meals or foods such as meat, fried foods, or fatty foods.  6 hours before the procedure - stop eating light meals or foods, such as toast or cereal.  6 hours before the procedure - stop drinking milk or  drinks that contain milk.  2 hours before the procedure - stop drinking clear liquids.  Medicines Ask your health care provider about:  Changing or stopping your regular medicines. This is especially important if you are taking diabetes medicines or blood thinners.  Taking medicines such as aspirin and ibuprofen. These medicines can thin your blood. Do not take these medicines before your procedure if your health care provider instructs you not to.  Tests and exams  You will have a physical exam.  You may have blood tests done to show: ? How well your kidneys and liver are working. ? How well your blood can clot.  General instructions  Plan to have someone take you home from the hospital or clinic.  If you will be going home right after the procedure, plan to have someone with you for 24 hours.  What happens during the procedure?  Your blood pressure, heart rate, breathing, level of pain and overall condition will be monitored.  An IV tube will be inserted into one of your veins.  Your anesthesia specialist will give you medicines as needed to keep you comfortable during the procedure. This may mean changing the level of sedation.  The procedure will be performed. After the procedure  Your blood pressure, heart rate, breathing rate, and blood oxygen level will be monitored until the medicines you were given have worn off.  Do not drive for 24 hours if you received a sedative.  You may: ? Feel sleepy, clumsy, or nauseous. ? Feel forgetful about what happened after the procedure. ? Have a sore throat if you had a breathing tube during the procedure. ? Vomit. This information is not intended to replace advice given to you by your health care provider. Make sure you discuss any questions you have with your health care provider. Document Released: 03/16/2005 Document Revised: 11/27/2015 Document Reviewed: 10/11/2015 Elsevier Interactive Patient Education  2018 [place], Care After These instructions provide you with information about caring for yourself after your procedure. Your  health care provider may also give you more specific instructions. Your treatment has been planned according to current medical practices, but problems sometimes occur. Call your health care provider if you have any problems or questions after your procedure. What can I expect after the procedure? After your procedure, it is common to:  Feel sleepy for several hours.  Feel clumsy and have poor balance for several hours.  Feel forgetful about what happened after the procedure.  Have poor judgment for several hours.  Feel nauseous or vomit.  Have a sore throat if you had a breathing tube during the procedure.  Follow these instructions at home: For at least 24 hours after the procedure:   Do not: ? Participate in activities in which you could fall or become injured. ? Drive. ? Use heavy machinery. ? Drink alcohol. ? Take sleeping pills or medicines that cause drowsiness. ? Make important decisions or sign legal documents. ? Take care of children on your own.  Rest. Eating and drinking  Follow the diet that is recommended by your health care provider.  If you vomit, drink water, juice, or soup when you can drink without vomiting.  Make sure you have little or no nausea before eating solid foods. General instructions  Have a responsible adult stay with you until you are awake and alert.  Take over-the-counter and prescription medicines only as told by your health care provider.  If you smoke, do not smoke without supervision.  Keep all follow-up visits as told by your health care provider. This is important. Contact a health care provider if:  You keep feeling nauseous or you keep vomiting.  You feel light-headed.  You develop a rash.  You have a fever. Get help right away if:  You have trouble breathing. This  information is not intended to replace advice given to you by your health care provider. Make sure you discuss any questions you have with your health care provider. Document Released: 10/11/2015 Document Revised: 02/10/2016 Document Reviewed: 10/11/2015 Elsevier Interactive Patient Education  [employer].

## 2017-05-03 ENCOUNTER — Encounter (HOSPITAL_COMMUNITY): Payer: Self-pay

## 2017-05-03 ENCOUNTER — Telehealth: Payer: Self-pay

## 2017-05-03 ENCOUNTER — Encounter (HOSPITAL_COMMUNITY)
Admission: RE | Admit: 2017-05-03 | Discharge: 2017-05-03 | Disposition: A | Discharge status: Home / Self Care | Source: Ambulatory Visit | Attending: Gastroenterology | Admitting: Gastroenterology

## 2017-05-03 NOTE — Telephone Encounter (Signed)
Endo scheduler called office. Pt was no show for her pre-op appt this morning. They were unable to contact pt. I tried to call pt, no answer, voicemail box is full.

## 2017-05-05 NOTE — Telephone Encounter (Signed)
I spoke with the patient's husband and made him aware of the new pre-op appt is 11/5 at 2:45 pm

## 2017-05-08 ENCOUNTER — Encounter (HOSPITAL_COMMUNITY)
Admission: RE | Admit: 2017-05-08 | Discharge: 2017-05-08 | Disposition: A | Discharge status: Home / Self Care | Source: Ambulatory Visit | Attending: Gastroenterology | Admitting: Gastroenterology

## 2017-05-08 ENCOUNTER — Other Ambulatory Visit: Payer: Self-pay

## 2017-05-08 ENCOUNTER — Telehealth: Payer: Self-pay | Admitting: *Deleted

## 2017-05-08 ENCOUNTER — Encounter (HOSPITAL_COMMUNITY): Payer: Self-pay

## 2017-05-08 DIAGNOSIS — F17210 Nicotine dependence, cigarettes, uncomplicated: Secondary | ICD-10-CM | POA: Diagnosis not present

## 2017-05-08 DIAGNOSIS — Z8249 Family history of ischemic heart disease and other diseases of the circulatory system: Secondary | ICD-10-CM | POA: Diagnosis not present

## 2017-05-08 DIAGNOSIS — K921 Melena: Secondary | ICD-10-CM | POA: Diagnosis not present

## 2017-05-08 DIAGNOSIS — K219 Gastro-esophageal reflux disease without esophagitis: Secondary | ICD-10-CM | POA: Diagnosis not present

## 2017-05-08 DIAGNOSIS — K644 Residual hemorrhoidal skin tags: Secondary | ICD-10-CM | POA: Diagnosis not present

## 2017-05-08 DIAGNOSIS — K648 Other hemorrhoids: Secondary | ICD-10-CM | POA: Diagnosis not present

## 2017-05-08 DIAGNOSIS — F411 Generalized anxiety disorder: Secondary | ICD-10-CM | POA: Diagnosis not present

## 2017-05-08 DIAGNOSIS — R5382 Chronic fatigue, unspecified: Secondary | ICD-10-CM | POA: Diagnosis not present

## 2017-05-08 DIAGNOSIS — E2839 Other primary ovarian failure: Secondary | ICD-10-CM | POA: Diagnosis not present

## 2017-05-08 DIAGNOSIS — Q438 Other specified congenital malformations of intestine: Secondary | ICD-10-CM | POA: Diagnosis not present

## 2017-05-08 DIAGNOSIS — Z90710 Acquired absence of both cervix and uterus: Secondary | ICD-10-CM | POA: Diagnosis not present

## 2017-05-08 DIAGNOSIS — F319 Bipolar disorder, unspecified: Secondary | ICD-10-CM | POA: Diagnosis not present

## 2017-05-08 DIAGNOSIS — Z79899 Other long term (current) drug therapy: Secondary | ICD-10-CM | POA: Diagnosis not present

## 2017-05-08 DIAGNOSIS — R197 Diarrhea, unspecified: Secondary | ICD-10-CM | POA: Diagnosis present

## 2017-05-08 DIAGNOSIS — D128 Benign neoplasm of rectum: Secondary | ICD-10-CM | POA: Diagnosis not present

## 2017-05-08 NOTE — Telephone Encounter (Signed)
Patient went to pre-op today and stated she was not given any prep instructions so she came by the office. I went over the instructions with patient and Rosendo Gros confirmed pharmacy still had prescription and would be ready for pick up in 30 minutes. Patient stated that when she has a BM that she is in physical pain to the point she becomes unconscious. She states this does not happen every single time. First she will start vomiting then becomes "unconscious". She stated she needed something for pain. Rosendo Gros told her we did not prescribe pain medications and then patient stated she just need 2 tablets to help get her through this prep that she has colitis.   Spoke with Vicente Males and she stated that we can not prescribe any pain medications. She needs to take it easy when she has a BM, no straining, do not sit for a prolonged time on the commode. She needs to have someone with her at home while she is prepping to keep an eye on her. If she becomes "unconscious" then she needs to call 911 to be evaluated as this is very serious.   Patient was advised of Anna's recommendations. She stated that she has 2 tablets of Lortab at home and was going to take these bc she can not bear the pain from her colitis w/ BM's. She asked if she could use "hemp oil" for the pain that you put under your tongue and we told her she absolutely could not use that. She then stated she would not use the oil then for her pain.   Patient spouse and Rosendo Gros was in the room when we discussed all of this with patient. Forwarding to [employer]

## 2017-05-08 NOTE — Telephone Encounter (Signed)
Patient showed up for her pre-op appt and stated that she ate Janine Limbo last night, however she has been on clear liquids since last night.  Per Dr. Oneida Alar she is ok to have her procedure for tomorrow.  The patient will be coming by the office to go over her prep instructions.

## 2017-05-08 NOTE — Telephone Encounter (Signed)
REVIEWED-NO ADDITIONAL RECOMMENDATIONS. 

## 2017-05-09 ENCOUNTER — Ambulatory Visit (HOSPITAL_COMMUNITY)
Admission: RE | Admit: 2017-05-09 | Discharge: 2017-05-09 | Disposition: A | Discharge status: Home / Self Care | Source: Ambulatory Visit | Attending: Gastroenterology | Admitting: Gastroenterology

## 2017-05-09 ENCOUNTER — Encounter (HOSPITAL_COMMUNITY)
Admission: RE | Disposition: A | Discharge status: Home / Self Care | Payer: Self-pay | Source: Ambulatory Visit | Attending: Gastroenterology

## 2017-05-09 ENCOUNTER — Ambulatory Visit (HOSPITAL_COMMUNITY): Admitting: Anesthesiology

## 2017-05-09 ENCOUNTER — Encounter (HOSPITAL_COMMUNITY): Payer: Self-pay | Admitting: *Deleted

## 2017-05-09 DIAGNOSIS — K921 Melena: Secondary | ICD-10-CM | POA: Diagnosis not present

## 2017-05-09 DIAGNOSIS — D128 Benign neoplasm of rectum: Secondary | ICD-10-CM | POA: Insufficient documentation

## 2017-05-09 DIAGNOSIS — R5382 Chronic fatigue, unspecified: Secondary | ICD-10-CM | POA: Insufficient documentation

## 2017-05-09 DIAGNOSIS — R933 Abnormal findings on diagnostic imaging of other parts of digestive tract: Secondary | ICD-10-CM

## 2017-05-09 DIAGNOSIS — Z8249 Family history of ischemic heart disease and other diseases of the circulatory system: Secondary | ICD-10-CM | POA: Insufficient documentation

## 2017-05-09 DIAGNOSIS — Q438 Other specified congenital malformations of intestine: Secondary | ICD-10-CM | POA: Insufficient documentation

## 2017-05-09 DIAGNOSIS — F411 Generalized anxiety disorder: Secondary | ICD-10-CM | POA: Insufficient documentation

## 2017-05-09 DIAGNOSIS — K644 Residual hemorrhoidal skin tags: Secondary | ICD-10-CM | POA: Insufficient documentation

## 2017-05-09 DIAGNOSIS — R197 Diarrhea, unspecified: Secondary | ICD-10-CM | POA: Insufficient documentation

## 2017-05-09 DIAGNOSIS — F319 Bipolar disorder, unspecified: Secondary | ICD-10-CM | POA: Insufficient documentation

## 2017-05-09 DIAGNOSIS — E2839 Other primary ovarian failure: Secondary | ICD-10-CM | POA: Insufficient documentation

## 2017-05-09 DIAGNOSIS — F17210 Nicotine dependence, cigarettes, uncomplicated: Secondary | ICD-10-CM | POA: Insufficient documentation

## 2017-05-09 DIAGNOSIS — K529 Noninfective gastroenteritis and colitis, unspecified: Secondary | ICD-10-CM

## 2017-05-09 DIAGNOSIS — K625 Hemorrhage of anus and rectum: Secondary | ICD-10-CM

## 2017-05-09 DIAGNOSIS — K648 Other hemorrhoids: Secondary | ICD-10-CM | POA: Insufficient documentation

## 2017-05-09 DIAGNOSIS — Z90710 Acquired absence of both cervix and uterus: Secondary | ICD-10-CM | POA: Insufficient documentation

## 2017-05-09 DIAGNOSIS — K219 Gastro-esophageal reflux disease without esophagitis: Secondary | ICD-10-CM | POA: Insufficient documentation

## 2017-05-09 DIAGNOSIS — Z79899 Other long term (current) drug therapy: Secondary | ICD-10-CM | POA: Insufficient documentation

## 2017-05-09 HISTORY — PX: BIOPSY: SHX5522

## 2017-05-09 HISTORY — PX: COLONOSCOPY WITH PROPOFOL: SHX5780

## 2017-05-09 SURGERY — COLONOSCOPY WITH PROPOFOL
Anesthesia: Monitor Anesthesia Care

## 2017-05-09 MED ORDER — PROPOFOL 500 MG/50ML IV EMUL
INTRAVENOUS | Status: DC | PRN
  Administered 2017-05-09: 150 ug/kg/min via INTRAVENOUS
  Administered 2017-05-09: 11:00:00 via INTRAVENOUS

## 2017-05-09 MED ORDER — LACTATED RINGERS IV SOLN
INTRAVENOUS | Status: DC
  Administered 2017-05-09: 09:00:00 via INTRAVENOUS

## 2017-05-09 MED ORDER — MIDAZOLAM HCL 2 MG/2ML IJ SOLN
1.0000 mg | Freq: Once | INTRAMUSCULAR | Status: AC | PRN
  Administered 2017-05-09: 2 mg via INTRAVENOUS

## 2017-05-09 MED ORDER — LIDOCAINE HCL (PF) 1 % IJ SOLN
INTRAMUSCULAR | Status: AC
  Filled 2017-05-09: qty 5

## 2017-05-09 MED ORDER — ONDANSETRON 4 MG PO TBDP
4.0000 mg | ORAL_TABLET | Freq: Once | ORAL | Status: AC
  Administered 2017-05-09: 4 mg via ORAL

## 2017-05-09 MED ORDER — HYDROMORPHONE HCL 1 MG/ML IJ SOLN
INTRAMUSCULAR | Status: AC
  Filled 2017-05-09: qty 1

## 2017-05-09 MED ORDER — CHLORHEXIDINE GLUCONATE CLOTH 2 % EX PADS: 6.0000 | MEDICATED_PAD | Freq: Once | CUTANEOUS | Status: DC

## 2017-05-09 MED ORDER — DICYCLOMINE HCL 10 MG PO CAPS: ORAL_CAPSULE | ORAL | 11 refills | Status: DC

## 2017-05-09 MED ORDER — PROPOFOL 10 MG/ML IV BOLUS
INTRAVENOUS | Status: AC
  Filled 2017-05-09: qty 40

## 2017-05-09 MED ORDER — PROPOFOL 10 MG/ML IV BOLUS
INTRAVENOUS | Status: AC
  Filled 2017-05-09: qty 20

## 2017-05-09 MED ORDER — MIDAZOLAM HCL 2 MG/2ML IJ SOLN
INTRAMUSCULAR | Status: AC
  Filled 2017-05-09: qty 2

## 2017-05-09 MED ORDER — ONDANSETRON 4 MG PO TBDP
ORAL_TABLET | ORAL | Status: AC
  Filled 2017-05-09: qty 1

## 2017-05-09 MED ORDER — HYDROMORPHONE HCL 1 MG/ML IJ SOLN
0.5000 mg | Freq: Once | INTRAMUSCULAR | Status: AC
Start: 2017-05-09 — End: 2017-05-09
  Administered 2017-05-09: 0.5 mg via INTRAVENOUS

## 2017-05-09 MED ORDER — PROPOFOL 10 MG/ML IV BOLUS
INTRAVENOUS | Status: DC | PRN
  Administered 2017-05-09 (×4): 20 mg via INTRAVENOUS

## 2017-05-09 NOTE — Discharge Instructions (Signed)
YOUR COLON AND SMALL BOWEL ARE NORMAL.YOUR ABDOMINAL PAIN AND DIARRHEA RE MOST LIKELY DUE TO IBS. You had 1 polyp removed. You have SMALL internal hemorrhoids, WHICH IS THE CAUSE FOR YOUR RECTAL BLEEDING..   DRINK WATER TO KEEP YOUR URINE LIGHT YELLOW.  FOLLOW A LOWFODMAP DIET for 8 weeks AVOID ITEMS THAT CAUSE BLOATING & GAS. SEE HANDOUT that I gave to your husband.  START BENTYL 10 MG 30 MINUTES PRIOR TO BREAKFAST AND LUNCH TO HELP CONTROL ABDOMINAL PAIN AND DIARRHEA. IT MAY CAUSE DRY MOUTH/EYES, DROWSINESS, DIFFICULTY URINATING, OR BLURRY VISION.  Use TWO TYLENOL EXTRA STRENGTH WITH IBUPROFEN 600 MG THREE TIMES A DAY TO HELP REDUCE PAIN FOR THE NEXT 2 WEEKS. TAKE THE MEDS WITH FOOD OR MILK.  YOUR BIOPSY RESULTS WILL BE AVAILABLE IN MY CHART AFTER NOV 10 AND MY OFFICE WILL CONTACT YOU IN 10-14 DAYS WITH YOUR RESULTS.   Please CALL IN ONE MONTH IF SYMPTOMS ARE NOT IMPROVED.   FOLLOW UP IN 4 MOS.   Next colonoscopy in 5-10 years.    Colonoscopy Care After Read the instructions outlined below and refer to this sheet in the next week. These discharge instructions provide you with general information on caring for yourself after you leave the hospital. While your treatment has been planned according to the most current medical practices available, unavoidable complications occasionally occur. If you have any problems or questions after discharge, call DR. Alin Hutchins, [phone].  ACTIVITY  You may resume your regular activity, but move at a slower pace for the next 24 hours.   Take frequent rest periods for the next 24 hours.   Walking will help get rid of the air and reduce the bloated feeling in your belly (abdomen).   No driving for 24 hours (because of the medicine (anesthesia) used during the test).   You may shower.   Do not sign any important legal documents or operate any machinery for 24 hours (because of the anesthesia used during the test).    NUTRITION  Drink plenty of  fluids.   You may resume your normal diet as instructed by your doctor.   Begin with a light meal and progress to your normal diet. Heavy or fried foods are harder to digest and may make you feel sick to your stomach (nauseated).   Avoid alcoholic beverages for 24 hours or as instructed.    MEDICATIONS  You may resume your normal medications.   WHAT YOU CAN EXPECT TODAY  Some feelings of bloating in the abdomen.   Passage of more gas than usual.   Spotting of blood in your stool or on the toilet paper  .  IF YOU HAD POLYPS REMOVED DURING THE COLONOSCOPY:  Eat a soft diet IF YOU HAVE NAUSEA, BLOATING, ABDOMINAL PAIN, OR VOMITING.    FINDING OUT THE RESULTS OF YOUR TEST Not all test results are available during your visit. DR. Oneida Alar WILL CALL YOU WITHIN 14 DAYS OF YOUR PROCEDUE WITH YOUR RESULTS. Do not assume everything is normal if you have not heard from DR. Daila Elbert, CALL HER OFFICE AT [phone].  SEEK IMMEDIATE MEDICAL ATTENTION AND CALL THE OFFICE: [phone] IF:  You have more than a spotting of blood in your stool.   Your belly is swollen (abdominal distention).   You are nauseated or vomiting.   You have a temperature over 101F.   You have abdominal pain or discomfort that is severe or gets worse throughout the day.   Irritable Bowel Syndrome (Spastic  Colon) Irritable Bowel Syndrome (IBS) is caused by a disturbance of normal bowel function. Other terms used are spastic colon, mucous colitis, and irritable colon. It does not require surgery, nor does it lead to cancer. There is no cure for IBS. But with proper diet, stress reduction, and medication, you will find that your problems (symptoms) will gradually disappear or improve. IBS is a common digestive disorder. It usually appears in late adolescence or early adulthood. Women develop it twice as often as men.  CAUSES After food has been digested and absorbed in the small intestine, waste material is moved  into the colon (large intestine). In the colon, water and salts are absorbed from the undigested products coming from the small intestine. The remaining residue, or fecal material, is held for elimination. Under normal circumstances, gentle, rhythmic contractions on the bowel walls push the fecal material along the colon towards the rectum. In IBS, however, these contractions are irregular and poorly coordinated. The fecal material is either retained too long, resulting in constipation, or expelled too soon, producing diarrhea.  SYMPTOMS  The most common symptom of IBS is pain. It is typically in the lower left side of the belly (abdomen). But it may occur anywhere in the abdomen. It can be felt as heartburn, backache, or even as a dull pain in the arms or shoulders. The pain comes from excessive bowel-muscle spasms and from the buildup of gas and fecal material in the colon. This pain:  Can range from sharp belly (abdominal) cramps to a dull, continuous ache.   Usually worsens soon after eating.   Is typically relieved by having a bowel movement or passing gas.    Abdominal pain is usually accompanied by diarrhea. The diarrhea typically occurs right after a meal or upon arising in the morning. The stools are typically soft and watery. They are often flecked with secretions (mucus). Other symptoms of IBS include:  Bloating.  Loss of appetite.   Heartburn.  Feeling sick to your stomach  (nausea).   Belching  Vomiting   Gas.  IBS may also cause a number of symptoms that are unrelated to the digestive system:  Fatigue.  Headaches.   Anxiety  Shortness of breath   Difficulty in concentrating.  Dizziness.   These symptoms tend to come and go.  DIAGNOSIS The symptoms of IBS closely mimic the symptoms of other, more serious digestive disorders. So your caregiver may wish to perform a variety of additional tests to exclude these disorders. He/she wants to be certain of learning what is  wrong (diagnosis). The nature and purpose of each test will be explained to you.  TREATMENT A number of medications are available to help correct bowel function and/or relieve bowel spasms and abdominal pain. Among the drugs available are:  Mild, non-irritating laxatives for severe constipation and to help restore normal bowel habits.   Specific anti-diarrheal medications to treat severe or prolonged diarrhea.   Anti-spasmodic agents(DICYCLOMINE) to relieve intestinal cramps.   HOME CARE INSTRUCTIONS   Avoid foods that are high in fat or oils. Some examples GBT:DVVOH cream, butter, frankfurters, sausage, and other fatty meats.   Avoid foods that have a laxative effect, such as fruit, fruit juice, and dairy products.   Cut out carbonated drinks, chewing gum, and "gassy" foods, such as beans and cabbage. This may help relieve bloating and belching.   Bran taken with plenty of liquids may help relieve constipation.   Keep track of what foods seem to trigger your  symptoms.   Avoid emotionally charged situations or circumstances that produce anxiety.   Start or continue exercising.   Get plenty of rest and sleep.    Polyps, Colon  A polyp is extra tissue that grows inside your body. Colon polyps grow in the large intestine. The large intestine, also called the colon, is part of your digestive system. It is a long, hollow tube at the end of your digestive tract where your body makes and stores stool. Most polyps are not dangerous. They are benign. This means they are not cancerous. But over time, some types of polyps can turn into cancer. Polyps that are smaller than a pea are usually not harmful. But larger polyps could someday become or may already be cancerous. To be safe, doctors remove all polyps and test them.   WHO GETS POLYPS? Anyone can get polyps, but certain people are more likely than others. You may have a greater chance of getting polyps if:  You are over 50.   You have  had polyps before.   Someone in your family has had polyps.   Someone in your family has had cancer of the large intestine.   Find out if someone in your family has had polyps. You may also be more likely to get polyps if you:   Eat a lot of fatty foods   Smoke   Drink alcohol   Do not exercise  Eat too much   PREVENTION There is not one sure way to prevent polyps. You might be able to lower your risk of getting them if you:  Eat more fruits and vegetables and less fatty food.   Do not smoke.   Avoid alcohol.   Exercise every day.   Lose weight if you are overweight.   Eating more calcium and folate can also lower your risk of getting polyps. Some foods that are rich in calcium are milk, cheese, and broccoli. Some foods that are rich in folate are chickpeas, kidney beans, and spinach.   Hemorrhoids Hemorrhoids are dilated (enlarged) veins around the rectum. Sometimes clots will form in the veins. This makes them swollen and painful. These are called thrombosed hemorrhoids. Causes of hemorrhoids include:  Constipation.   Straining to have a bowel movement.   HEAVY LIFTING  HOME CARE INSTRUCTIONS  Eat a well balanced diet and drink 6 to 8 glasses of water every day to avoid constipation. You may also use a bulk laxative.   Avoid straining to have bowel movements.   Keep anal area dry and clean.   Do not use a donut shaped pillow or sit on the toilet for long periods. This increases blood pooling and pain.   Move your bowels when your body has the urge; this will require less straining and will decrease pain and pressure.

## 2017-05-09 NOTE — Op Note (Signed)
[facility] Patient Name: Samantha Lyons Procedure Date: 05/09/2017 10:00 AM MRN: [id number] Date of Birth: 25-Oct-1970 Attending MD: Barney Drain MD, MD CSN: [id number] Age: 46 Admit Type: Outpatient Procedure:                Colonoscopy WITH COLD SNARE POLYPECTOMY Indications:              Clinically significant diarrhea of unexplained                            origin, Hematochezia, Abnormal CT of the GI tract:                            SEP 2018 COLITIS Providers:                Barney Drain MD, MD, Lurline Del, RN, Rosina Lowenstein,                            RN Referring MD:             Ma Hillock Medicines:                Propofol per Anesthesia Complications:            No immediate complications. Estimated Blood Loss:     Estimated blood loss was minimal. Procedure:                Pre-Anesthesia Assessment:                           - Prior to the procedure, a History and Physical                            was performed, and patient medications and                            allergies were reviewed. The patient's tolerance of                            previous anesthesia was also reviewed. The risks                            and benefits of the procedure and the sedation                            options and risks were discussed with the patient.                            All questions were answered, and informed consent                            was obtained. Prior Anticoagulants: The patient has                            taken ibuprofen, last dose was 7 days prior to  procedure. ASA Grade Assessment: II - A patient                            with mild systemic disease. After reviewing the                            risks and benefits, the patient was deemed in                            satisfactory condition to undergo the procedure.                            After obtaining informed consent, the colonoscope                            was  passed under direct vision. Throughout the                            procedure, the patient's blood pressure, pulse, and                            oxygen saturations were monitored continuously. The                            EC-[id number] ([id number]) scope was introduced through                            the anus and advanced to the 5 cm into the ileum.                            The colonoscopy was technically difficult and                            complex due to a tortuous colon. Successful                            completion of the procedure was aided by                            straightening and shortening the scope to obtain                            bowel loop reduction and COLOWRAP. The patient                            tolerated the procedure well. The quality of the                            bowel preparation was good. The terminal ileum,                            ileocecal valve, appendiceal orifice, and rectum  were photographed. Scope In: 10:40:19 AM Scope Out: 11:02:38 AM Scope Withdrawal Time: 0 hours 17 minutes 8 seconds  Total Procedure Duration: 0 hours 22 minutes 19 seconds  Findings:      A 4 mm polyp was found in the rectum. The polyp was sessile. The polyp       was removed with a cold snare. Resection and retrieval were complete.      The sigmoid colon, descending colon, transverse colon, ascending colon       and cecum were moderately redundant.      External and internal hemorrhoids were found during retroflexion. The       hemorrhoids were moderate.      - OTHERWISE NORMAL EXAM Impression:               - One 4 mm polyp in the rectum, removed with a cold                            snare. Resected and retrieved.                           - Redundant LEFT colon.                           - External and internal hemorrhoids.                           - ABDOMINAL PAIN DUE TO IBS, LESS LIKELY                            MICROSCOPIC  COLITIS Moderate Sedation:      Per Anesthesia Care Recommendation:           - Repeat colonoscopy in 5-10 years for surveillance.                           - High fiber/LOW FODMAP diet.                           - Continue present medications.                           - Await pathology results.                           - ADD BENTYL BEFORE BREAKFAST AND LUNCH                           - Return to my office in 4 months.                           - Patient has a contact number available for                            emergencies. The signs and symptoms of potential                            delayed complications were discussed with the  patient. Return to normal activities tomorrow.                            Written discharge instructions were provided to the                            patient. Procedure Code(s):        --- Professional ---                           [id number], Colonoscopy, flexible; with removal of                            tumor(s), polyp(s), or other lesion(s) by snare                            technique Diagnosis Code(s):        --- Professional ---                           K62.1, Rectal polyp                           K64.8, Other hemorrhoids                           R19.7, Diarrhea, unspecified                           K92.1, Melena (includes Hematochezia)                           R93.3, Abnormal findings on diagnostic imaging of                            other parts of digestive tract                           Q43.8, Other specified congenital malformations of                            intestine CPT copyright 2016 [employer]. All rights reserved. The codes documented in this report are preliminary and upon coder review may  be revised to meet current compliance requirements. Barney Drain, MD Barney Drain MD, MD 05/09/2017 11:13:41 AM This report has been signed electronically. Number of Addenda: 0

## 2017-05-09 NOTE — Anesthesia Preprocedure Evaluation (Addendum)
Anesthesia Evaluation  Patient identified by MRN, date of birth, ID band Patient awake    Airway Mallampati: I  TM Distance: >3 FB     Dental  (+) Teeth Intact   Pulmonary Current Smoker,    Pulmonary exam normal        Cardiovascular Exercise Tolerance: Good  Rhythm:Regular Rate:Normal     Neuro/Psych Anxiety Depression Bipolar Disorder    GI/Hepatic GERD  Medicated,  Endo/Other  negative endocrine ROS  Renal/GU negative Renal ROS     Musculoskeletal   Abdominal Normal abdominal exam  (+)  Abdomen: soft.    Peds  Hematology negative hematology ROS (+) Results for MALASHIA, KAMAKA (MRN [id number]) as of 05/09/2017 08:44  04/07/2017 14:13 Hemoglobin: 12.9 HCT: 36.4 MCV: 85.2 MCH: 30.2 MCHC: 35.4 RDW: 12.5 Platelets: 335    Anesthesia Other Findings   Reproductive/Obstetrics                           Anesthesia Physical Anesthesia Plan  ASA: II  Anesthesia Plan: MAC   Post-op Pain Management:    Induction:   PONV Risk Score and Plan: 1  Airway Management Planned: Simple Face Mask  Additional Equipment:   Intra-op Plan:   Post-operative Plan:   Informed Consent: I have reviewed the patients History and Physical, chart, labs and discussed the procedure including the risks, benefits and alternatives for the proposed anesthesia with the patient or authorized representative who has indicated his/her understanding and acceptance.   Dental advisory given  Plan Discussed with:   Anesthesia Plan Comments:         Anesthesia Quick Evaluation

## 2017-05-09 NOTE — Anesthesia Postprocedure Evaluation (Signed)
Anesthesia Post Note  Patient: Elea R Leonor  Procedure(s) Performed: COLONOSCOPY WITH PROPOFOL (N/A ) BIOPSY  Patient location during evaluation: PACU Anesthesia Type: MAC Level of consciousness: awake and alert Pain management: satisfactory to patient Vital Signs Assessment: post-procedure vital signs reviewed and stable Respiratory status: spontaneous breathing Cardiovascular status: stable Postop Assessment: no apparent nausea or vomiting and adequate PO intake Anesthetic complications: no     Last Vitals:  Vitals:   05/09/17 1130 05/09/17 1145  BP: 109/84 107/81  Pulse: 75 75  Resp: 10 (!) 22  Temp:    SpO2: 96% 97%    Last Pain:  Vitals:   05/09/17 1145  TempSrc:   PainSc: 9                  Britny Riel

## 2017-05-09 NOTE — Transfer of Care (Signed)
Immediate Anesthesia Transfer of Care Note  Patient: Samantha Lyons  Procedure(s) Performed: COLONOSCOPY WITH PROPOFOL (N/A ) BIOPSY  Patient Location: PACU  Anesthesia Type:MAC  Level of Consciousness: awake and alert   Airway & Oxygen Therapy: Patient Spontanous Breathing  Post-op Assessment: Report given to RN  Post vital signs: Reviewed and stable  Last Vitals:  Vitals:   05/09/17 0835  Temp: 36.5 C    Last Pain:  Vitals:   05/09/17 0835  TempSrc: Oral      Patients Stated Pain Goal: 4 (69/62/95 2841)  Complications: No apparent anesthesia complications

## 2017-05-09 NOTE — Addendum Note (Signed)
Addendum  created 05/09/17 1217 by Ollen Bowl, CRNA   Charge Capture section accepted

## 2017-05-09 NOTE — H&P (Signed)
Primary Care Physician:  Ma Hillock, DO Primary Gastroenterologist:  Dr. Oneida Alar  Pre-Procedure History & Physical: HPI:  Samantha Lyons is a 46 y.o. female here for RECTAL BLEEDING/CT ABD.PELVIS-COLITIS/   Past Medical History:  Diagnosis Date  . Bipolar disorder, unspecified ([place])   . Blood in stool   . Cholelithiasis   . Chronic fatigue syndrome   . Colitis   . Depression   . Eating disorder   . Endometriosis   . Female hypogonadism   . Generalized anxiety disorder   . GERD (gastroesophageal reflux disease)   . History of chicken pox   . Low testosterone level in female    05/08/2014  . Seasonal allergies   . Tobacco use     Past Surgical History:  Procedure Laterality Date  . ABDOMINAL HYSTERECTOMY  10/23/08  . ABDOMINOPLASTY    . BARTHOLIN GLAND CYST EXCISION    . CESAREAN SECTION      Prior to Admission medications   Medication Sig Start Date End Date Taking? Authorizing Provider  buPROPion (WELLBUTRIN SR) 150 MG 12 hr tablet Take 150 mg by mouth 2 (two) times daily. Takes 2 in the morning and one in the afternoon   Yes [provider]  ciprofloxacin (CIPRO) 500 MG tablet Take 1 tablet (500 mg total) by mouth every 12 (twelve) hours. 04/02/17  Yes Noemi Chapel, MD  clonazePAM (KLONOPIN) 1 MG tablet Take 1 tablet by mouth 2 (two) times daily as needed for anxiety. 03/29/17  Yes [provider]  Estradiol-Estriol-Progesterone (BIEST/PROGESTERONE TD) Place onto the skin 2 (two) times daily. 4 clicks applied to wither forearm daily (Biest 0.6md (8/2)/ Progest 20/83ml   Yes [provider]  HYDROcodone-acetaminophen (NORCO/VICODIN) 5-325 MG tablet Take 2 tablets by mouth every 4 (four) hours as needed. 04/02/17  Yes Noemi Chapel, MD  ibuprofen (ADVIL,MOTRIN) 600 MG tablet Take 1 tablet (600 mg total) by mouth every 6 (six) hours as needed. 04/02/17  Yes Noemi Chapel, MD  lamoTRIgine (LAMICTAL) 200 MG tablet Take 200 mg by mouth daily.  01/01/17   Yes [provider]  metroNIDAZOLE (FLAGYL) 500 MG tablet Take 1 tablet (500 mg total) by mouth 2 (two) times daily. 04/02/17  Yes Noemi Chapel, MD  polyethylene glycol-electrolytes (TRILYTE) 420 g solution Take 4,000 mLs by mouth as directed. 04/07/17  Yes Fields, Marga Melnick, MD  promethazine (PHENERGAN) 12.5 MG tablet Take 1 tablet (12.5 mg total) by mouth every 8 (eight) hours as needed for nausea or vomiting. 04/07/17  Yes Carlis Stable, NP  TESTOSTERONE TD Place 0.2 mg onto the skin every Monday, Wednesday, and Friday.    Yes [provider]  zolpidem (AMBIEN) 5 MG tablet Take 5 mg by mouth at bedtime.  09/30/15  Yes [provider]  cloNIDine (CATAPRES) 0.1 MG tablet Take 1 tablet by mouth 2 (two) times daily as needed (BP).  03/29/17   [provider]    Allergies as of 04/10/2017  . (No Known Allergies)    Family History  Problem Relation Age of Onset  . Osteoporosis Mother   . Arthritis Mother        OA  . Stroke Mother   . Hypertension Mother   . Heart disease Father   . Osteoporosis Father   . Alzheimer's disease Father   . Diabetes Brother   . Colon cancer Maternal Grandmother   . Cancer Maternal Grandmother   . Alzheimer's disease Paternal Grandmother   . Multiple sclerosis  Brother   . Stroke Brother   . Inflammatory bowel disease Neg Hx   . Crohn's disease Neg Hx     Social History   Socioeconomic History  . Marital status: Married    Spouse name: Not on file  . Number of children: Not on file  . Years of education: Not on file  . Highest education level: Not on file  Social Needs  . Financial resource strain: Not on file  . Food insecurity - worry: Not on file  . Food insecurity - inability: Not on file  . Transportation needs - medical: Not on file  . Transportation needs - non-medical: Not on file  Occupational History  . Not on file  Tobacco Use  . Smoking status: Current Every Day Smoker    Packs/day: 0.25    Years:  20.00    Pack years: 5.00    Types: Cigarettes  . Smokeless tobacco: Never Used  Substance and Sexual Activity  . Alcohol use: No    Comment: beer occ  . Drug use: Yes    Frequency: 7.0 times per week    Types: Marijuana    Comment: started with abdominal pain  . Sexual activity: Yes    Birth control/protection: Surgical, None  Other Topics Concern  . Not on file  Social History Narrative   Married. High school grad.   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    Smoker. MJ use.  Herbal remedies, seat belts, wears a bike helmet. Exercises more than 3 times a week.    SMoke alarm in the home.    Guns in the home (in a locked cabinet)    Review of Systems: See HPI, otherwise negative ROS   Physical Exam: Temp 97.7 F (36.5 C) (Oral)  General:   Alert,  pleasant and cooperative in NAD Head:  Normocephalic and atraumatic. Neck:  Supple; Lungs:  Clear throughout to auscultation.    Heart:  Regular rate and rhythm. Abdomen:  Soft, nontender and nondistended. Normal bowel sounds, without guarding, and without rebound.   Neurologic:  Alert and  oriented x4;  grossly normal neurologically.  Impression/Plan:     RECTAL BLEEDING/CT ABD.PELVIS-COLITIS  PLAN:  1. TCS TODAY DISCUSSED PROCEDURE, BENEFITS, & RISKS: < 1% chance of medication reaction, bleeding, perforation, or rupture of spleen/liver.

## 2017-05-10 ENCOUNTER — Encounter: Payer: Self-pay | Admitting: Family Medicine

## 2017-05-11 ENCOUNTER — Encounter (HOSPITAL_COMMUNITY): Payer: Self-pay | Admitting: Gastroenterology

## 2017-05-18 ENCOUNTER — Telehealth: Payer: Self-pay | Admitting: Gastroenterology

## 2017-05-18 NOTE — Telephone Encounter (Signed)
Please call pt. She had ONE simple adenoma removed. Her colon biopsies are normal. HER ABDOMINAL PAIN AND DIARRHEA ARE MOST LIKELY DUE TO IBS.    DRINK WATER TO KEEP YOUR URINE LIGHT YELLOW. FOLLOW A LOWFODMAP DIET for 8 weeks AVOID ITEMS THAT CAUSE BLOATING & GAS.   START BENTYL 10 MG 30 MINUTES PRIOR TO BREAKFAST AND LUNCH TO HELP CONTROL ABDOMINAL PAIN AND DIARRHEA. IT MAY CAUSE DRY MOUTH/EYES, DROWSINESS, DIFFICULTY URINATING, OR BLURRY VISION.  Use TWO TYLENOL EXTRA STRENGTH WITH IBUPROFEN 600 MG THREE TIMES A DAY TO HELP REDUCE PAIN FOR THE NEXT 2 WEEKS. TAKE THE MEDS WITH FOOD OR MILK.  CALL IN ONE MONTH IF SYMPTOMS ARE NOT IMPROVED.   FOLLOW UP IN 4 MOS E30 ABDOMINAL PAIN/DIARRHEA.   NEXT COLONOSCOPY IN 5-10 YEARS.

## 2017-05-18 NOTE — Telephone Encounter (Signed)
Reminder in epic °

## 2017-05-18 NOTE — Telephone Encounter (Signed)
PT is aware. F[employer] in the mail.  Pt said that [facility] in [place] does not have enzymes that Dr. Oneida Alar was going to send in for her.  Dr. Oneida Alar, please advise!

## 2017-05-19 MED ORDER — LACTASE 3000 UNITS PO TABS: ORAL_TABLET | ORAL | 11 refills | Status: DC

## 2017-05-19 NOTE — Telephone Encounter (Signed)
APPT MADE AND ON RECALL  °

## 2017-05-19 NOTE — Telephone Encounter (Signed)
PLEASE CALL PT. Her Rx for lactase was sent to [facility]. ADD LACTASE 2-3 PILLS WITH MEALS THREE TIMES A DAY. IT MAY OR MAY NOT BE COVERED BY HER INSURANCE.

## 2017-05-19 NOTE — Telephone Encounter (Signed)
Left the message on VM and asked her to call if she has questions.

## 2017-05-19 NOTE — Addendum Note (Signed)
Addended by: Danie Binder on: 05/19/2017 09:21 AM   Modules accepted: Orders

## 2017-05-20 DIAGNOSIS — D369 Benign neoplasm, unspecified site: Secondary | ICD-10-CM

## 2017-05-20 HISTORY — DX: Benign neoplasm, unspecified site: D36.9

## 2017-07-11 ENCOUNTER — Encounter: Payer: Self-pay | Admitting: Nurse Practitioner

## 2017-07-11 ENCOUNTER — Telehealth: Payer: Self-pay | Admitting: Nurse Practitioner

## 2017-07-11 ENCOUNTER — Ambulatory Visit: Admitting: Nurse Practitioner

## 2017-07-11 NOTE — Telephone Encounter (Signed)
PATIENT WAS A NO SHOW AND LETTER SENT  °

## 2017-07-11 NOTE — Telephone Encounter (Signed)
Noted  

## 2018-03-13 ENCOUNTER — Ambulatory Visit (INDEPENDENT_AMBULATORY_CARE_PROVIDER_SITE_OTHER): Admitting: Orthopaedic Surgery

## 2018-03-27 ENCOUNTER — Encounter (INDEPENDENT_AMBULATORY_CARE_PROVIDER_SITE_OTHER): Payer: Self-pay

## 2018-03-27 ENCOUNTER — Ambulatory Visit (INDEPENDENT_AMBULATORY_CARE_PROVIDER_SITE_OTHER): Admitting: Orthopaedic Surgery

## 2018-04-25 LAB — HM PAP SMEAR

## 2018-11-19 ENCOUNTER — Emergency Department (HOSPITAL_COMMUNITY)
Admission: EM | Admit: 2018-11-19 | Discharge: 2018-11-19 | Disposition: A | Discharge status: Home / Self Care | Attending: Emergency Medicine | Admitting: Emergency Medicine

## 2018-11-19 ENCOUNTER — Encounter (HOSPITAL_COMMUNITY): Payer: Self-pay | Admitting: Emergency Medicine

## 2018-11-19 ENCOUNTER — Ambulatory Visit: Payer: Self-pay | Admitting: *Deleted

## 2018-11-19 ENCOUNTER — Other Ambulatory Visit: Payer: Self-pay

## 2018-11-19 DIAGNOSIS — F17210 Nicotine dependence, cigarettes, uncomplicated: Secondary | ICD-10-CM | POA: Diagnosis not present

## 2018-11-19 DIAGNOSIS — L299 Pruritus, unspecified: Secondary | ICD-10-CM | POA: Diagnosis not present

## 2018-11-19 DIAGNOSIS — R600 Localized edema: Secondary | ICD-10-CM

## 2018-11-19 DIAGNOSIS — R21 Rash and other nonspecific skin eruption: Secondary | ICD-10-CM | POA: Diagnosis not present

## 2018-11-19 DIAGNOSIS — H05223 Edema of bilateral orbit: Secondary | ICD-10-CM | POA: Diagnosis not present

## 2018-11-19 LAB — URINALYSIS, ROUTINE W REFLEX MICROSCOPIC
Bilirubin Urine: NEGATIVE
Glucose, UA: NEGATIVE mg/dL
Hgb urine dipstick: NEGATIVE
Ketones, ur: NEGATIVE mg/dL
Leukocytes,Ua: NEGATIVE
Nitrite: NEGATIVE
Protein, ur: NEGATIVE mg/dL
Specific Gravity, Urine: 1.006 (ref 1.005–1.030)
pH: 6 (ref 5.0–8.0)

## 2018-11-19 LAB — COMPREHENSIVE METABOLIC PANEL
ALT: 21 U/L (ref 0–44)
AST: 22 U/L (ref 15–41)
Albumin: 4.3 g/dL (ref 3.5–5.0)
Alkaline Phosphatase: 60 U/L (ref 38–126)
Anion gap: 9 (ref 5–15)
BUN: 8 mg/dL (ref 6–20)
CO2: 27 mmol/L (ref 22–32)
Calcium: 9.5 mg/dL (ref 8.9–10.3)
Chloride: 105 mmol/L (ref 98–111)
Creatinine, Ser: 0.84 mg/dL (ref 0.44–1.00)
GFR calc Af Amer: >60 mL/min (ref >60)
GFR calc non Af Amer: >60 mL/min (ref >60)
Glucose, Bld: 88 mg/dL (ref 70–99)
Potassium: 4.2 mmol/L (ref 3.5–5.1)
Sodium: 141 mmol/L (ref 135–145)
Total Bilirubin: 0.6 mg/dL (ref 0.3–1.2)
Total Protein: 6.9 g/dL (ref 6.5–8.1)

## 2018-11-19 LAB — CBC
HCT: 39.7 % (ref 36.0–46.0)
Hemoglobin: 13.2 g/dL (ref 12.0–15.0)
MCH: 29.8 pg (ref 26.0–34.0)
MCHC: 33.2 g/dL (ref 30.0–36.0)
MCV: 89.6 fL (ref 80.0–100.0)
Platelets: 294 10*3/uL (ref 150–400)
RBC: 4.43 MIL/uL (ref 3.87–5.11)
RDW: 11.4 % — ABNORMAL LOW (ref 11.5–15.5)
WBC: 7.6 10*3/uL (ref 4.0–10.5)
nRBC: 0 % (ref 0.0–0.2)

## 2018-11-19 MED ORDER — DIPHENHYDRAMINE HCL 25 MG PO CAPS
50.0000 mg | ORAL_CAPSULE | Freq: Once | ORAL | Status: AC
  Administered 2018-11-19: 50 mg via ORAL
  Filled 2018-11-19: qty 2

## 2018-11-19 NOTE — Telephone Encounter (Signed)
FYI  Please see below

## 2018-11-19 NOTE — Telephone Encounter (Signed)
Pt called with having swelling in her face and eyes this morning. She had hives tht started yesterday on her left arm. Today she has hives and itching on her arms, legs and chest  She has not taken any new medication or has been out in the environment. She is advised to go to an urgent care of Ed to be assessed, since she is having facial swelling. Flow at [facility] at [facility] notified regarding disposition and agreed. Pt is going to [facility] ED for treatment. No protocol used.

## 2018-11-19 NOTE — ED Provider Notes (Signed)
[facility] Emergency Department Provider Note MRN:  [id number]  Arrival date & time: 11/19/18     Chief Complaint   Allergic Reaction   History of Present Illness   Samantha Lyons is a 48 y.o. year-old female with a history of bipolar disorder, anxiety disorder, chronic fatigue syndrome presenting to the ED with chief complaint of allergic reaction.  For the past 3 days, patient has been experiencing itchy rash.  Began on her back with a red rash that has since resolved, but the itchiness to the back has continued.  She then noticed rash to her left arm, which also seems to be improving.  Woke up this morning with swelling around both of her eyes.  Called her regular doctor, who advised visit to the emergency department.  Denies fever, no cough, no chest pain or shortness of breath, no abdominal pain, no dysuria.  Noted some fatigue yesterday.  No vomiting, no diarrhea.  No new medications, no new soaps or detergents, no new lotions, no new activities of any kind.  Complaining of more diffuse itchiness to her legs without necessarily having a rash in these areas as well.  Review of Systems  A complete 10 system review of systems was obtained and all systems are negative except as noted in the HPI and PMH.   Patient's Health History    Past Medical History:  Diagnosis Date  . Bipolar disorder, unspecified ([place])   . Blood in stool   . Cholelithiasis   . Chronic fatigue syndrome   . Colitis   . Depression   . Eating disorder   . Endometriosis   . Female hypogonadism   . Generalized anxiety disorder   . GERD (gastroesophageal reflux disease)   . History of chicken pox   . Low testosterone level in female    05/08/2014  . Seasonal allergies   . Tobacco use   . Tubular adenoma 05/20/2017    Past Surgical History:  Procedure Laterality Date  . ABDOMINAL HYSTERECTOMY  10/23/08  . ABDOMINOPLASTY    . BARTHOLIN GLAND CYST EXCISION    . BIOPSY  05/09/2017   Procedure: BIOPSY;  Surgeon: Danie Binder, MD;  Location: [facility] END[facility];  Service: Endoscopy;;  random colon;  . CESAREAN SECTION    . CHOLECYSTECTOMY N/A 01/27/2014   Procedure: LAPAROSCOPIC CHOLECYSTECTOMY;  Surgeon: [name], MD;  Location: [facility] ORS;  Service: General;  Laterality: N/A;  . COLONOSCOPY WITH PROPOFOL N/A 05/09/2017   Procedure: COLONOSCOPY WITH PROPOFOL;  Surgeon: Danie Binder, MD;  Location: [facility] ENDO SUITE;  Service: Endoscopy;  Laterality: N/A;    Family History  Problem Relation Age of Onset  . Osteoporosis Mother   . Arthritis Mother        OA  . Stroke Mother   . Hypertension Mother   . Heart disease Father   . Osteoporosis Father   . Alzheimer's disease Father   . Diabetes Brother   . Colon cancer Maternal Grandmother   . Cancer Maternal Grandmother   . Alzheimer's disease Paternal Grandmother   . Multiple sclerosis Brother   . Stroke Brother   . Inflammatory bowel disease Neg Hx   . Crohn's disease Neg Hx     Social History   Socioeconomic History  . Marital status: Married    Spouse name: Not on file  . Number of children: Not on file  . Years of education: Not on file  . Highest education level: Not  on file  Occupational History  . Not on file  Social Needs  . Financial resource strain: Not on file  . Food insecurity:    Worry: Not on file    Inability: Not on file  . Transportation needs:    Medical: Not on file    Non-medical: Not on file  Tobacco Use  . Smoking status: Current Every Day Smoker    Packs/day: 0.25    Years: 20.00    Pack years: 5.00    Types: Cigarettes  . Smokeless tobacco: Never Used  Substance and Sexual Activity  . Alcohol use: No    Comment: beer occ  . Drug use: Yes    Frequency: 7.0 times per week    Types: Marijuana    Comment: started with abdominal pain  . Sexual activity: Yes    Birth control/protection: Surgical, None  Lifestyle  . Physical activity:    Days per week: Not on file    Minutes  per session: Not on file  . Stress: Not on file  Relationships  . Social connections:    Talks on phone: Not on file    Gets together: Not on file    Attends religious service: Not on file    Active member of club or organization: Not on file    Attends meetings of clubs or organizations: Not on file    Relationship status: Not on file  . Intimate partner violence:    Fear of current or ex partner: Not on file    Emotionally abused: Not on file    Physically abused: Not on file    Forced sexual activity: Not on file  Other Topics Concern  . Not on file  Social History Narrative   Married. High school grad.   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    Smoker. MJ use.  Herbal remedies, seat belts, wears a bike helmet. Exercises more than 3 times a week.    SMoke alarm in the home.    Guns in the home (in a locked cabinet)     Physical Exam  Vital Signs and Nursing Notes reviewed Vitals:   11/19/18 1248  BP: 121/79  Pulse: 64  Resp: 14  Temp: 98 F (36.7 C)  SpO2: 99%    CONSTITUTIONAL: Well-appearing, NAD NEURO:  Alert and oriented x 3, no focal deficits EYES:  eyes equal and reactive, normal extraocular movements, normal visual acuity, subtle bilateral periorbital edema ENT/NECK:  no LAD, no JVD CARDIO: Regular rate, well-perfused, normal S1 and S2 PULM:  CTAB no wheezing or rhonchi GI/GU:  normal bowel sounds, non-distended, non-tender MSK/SPINE:  No gross deformities, no edema SKIN:  no rash, atraumatic PSYCH:  Appropriate speech and behavior  Diagnostic and Interventional Summary    Labs Reviewed  CBC - Abnormal; Notable for the following components:      Result Value   RDW 11.4 (*)    All other components within normal limits  URINALYSIS, ROUTINE W REFLEX MICROSCOPIC - Abnormal; Notable for the following components:   Color, Urine STRAW (*)    All other components within normal limits  COMPREHENSIVE METABOLIC PANEL    No orders to display     Medications  diphenhydrAMINE (BENADRYL) capsule 50 mg (50 mg Oral Given 11/19/18 1307)     Procedures Critical Care  ED Course and Medical Decision Making  I have reviewed the triage vital signs and the nursing notes.  Pertinent labs & imaging results that  were available during my care of the patient were reviewed by me and considered in my medical decision making (see below for details).  Favoring mild allergic reaction, possibly seasonal allergies given that she does not have evident hives on exam, her only objective findings today are some very subtle edema and erythema to the periorbital region bilaterally.  Normal vital signs, clear lungs, no wheezing, no recent vomiting or diarrhea, no trouble breathing or sensation of throat closure, nothing to suggest severe reaction or anaphylaxis.  Will obtain screening labs and urinalysis given the diffuse itching and fatigue yesterday to exclude hepatic ideology or UTI.  Will provide Benadryl.  With a negative work-up will refer to allergy and discharge.  Clinical Course as of Nov 19 1438  Mon Nov 19, 2018  1305 Patient reports a history of hysterectomy and therefore pregnancy testing is not indicated today.   [MB]    Clinical Course User Index [MB] Maudie Flakes, MD    Labs are reassuring, normal LFTs, normal kidney function, no protein in the urine, no evidence of underlying metabolic cause of the periorbital edema.  My exam patient swelling seems either the same or minimally improved.  Advise Benadryl, PCP follow-up.  After the discussed management above, the patient was determined to be safe for discharge.  The patient was in agreement with this plan and all questions regarding their care were answered.  ED return precautions were discussed and the patient will return to the ED with any significant worsening of condition.  Barth Kirks. Sedonia Small, [place] [email or web address]  Final  Clinical Impressions(s) / ED Diagnoses     ICD-10-CM   1. Rash R21   2. Itching L29.9   3. Periorbital edema R60.0     ED Discharge Orders    None         Maudie Flakes, MD 11/19/18 1441

## 2018-11-19 NOTE — ED Triage Notes (Signed)
Pt reports "hives" on arm and chest x 2 days. Pt states she woke up this morning and her eyes were swollen. Pt denies any lip/tongue swelling or SOB. Pt denies any new medications, detergents, soaps, etc. NAD noted at this time.

## 2018-11-19 NOTE — Discharge Instructions (Addendum)
You were evaluated in the Emergency Department and after careful evaluation, we did not find any emergent condition requiring admission or further testing in the hospital.  Your symptoms today seem to be due to the development of a new allergy.  Your testing today was reassuring and did not show any other more concerning causes.  Please use Benadryl as needed for your symptoms and discuss this with your primary care doctor.  Please return to the Emergency Department if you experience any worsening of your condition.  We encourage you to follow up with a primary care provider.  Thank you for allowing [place] to be a part of your care.

## 2018-11-21 ENCOUNTER — Emergency Department (HOSPITAL_COMMUNITY)
Admission: EM | Admit: 2018-11-21 | Discharge: 2018-11-21 | Disposition: A | Discharge status: Home / Self Care | Attending: Emergency Medicine | Admitting: Emergency Medicine

## 2018-11-21 ENCOUNTER — Other Ambulatory Visit: Payer: Self-pay

## 2018-11-21 ENCOUNTER — Encounter (HOSPITAL_COMMUNITY): Payer: Self-pay

## 2018-11-21 DIAGNOSIS — L237 Allergic contact dermatitis due to plants, except food: Secondary | ICD-10-CM | POA: Insufficient documentation

## 2018-11-21 DIAGNOSIS — F17210 Nicotine dependence, cigarettes, uncomplicated: Secondary | ICD-10-CM | POA: Diagnosis not present

## 2018-11-21 DIAGNOSIS — Z79899 Other long term (current) drug therapy: Secondary | ICD-10-CM | POA: Insufficient documentation

## 2018-11-21 DIAGNOSIS — R21 Rash and other nonspecific skin eruption: Secondary | ICD-10-CM | POA: Diagnosis present

## 2018-11-21 MED ORDER — PREDNISONE 10 MG PO TABS: ORAL_TABLET | ORAL | 0 refills | Status: DC

## 2018-11-21 NOTE — Discharge Instructions (Signed)
Buy a good hypoallergenic moisturizer like aveeno

## 2018-11-21 NOTE — ED Provider Notes (Signed)
[place] [place] Provider Note   CSN: [id number] Arrival date & time: 11/21/18  1779    History   Chief Complaint Chief Complaint  Patient presents with  . Rash    HPI Samantha Lyons is a 48 y.o. female.     The history is provided by the patient. No language interpreter was used.  Rash  Location:  Torso, shoulder/arm and face Facial rash location:  Face Shoulder/arm rash location:  L arm and R arm Quality: itchiness and redness   Severity:  Moderate Onset quality:  Gradual Timing:  Constant Progression:  Worsening Chronicity:  New Worsened by:  Nothing Ineffective treatments:  Antihistamines   Past Medical History:  Diagnosis Date  . Bipolar disorder, unspecified ([place])   . Blood in stool   . Cholelithiasis   . Chronic fatigue syndrome   . Colitis   . Depression   . Eating disorder   . Endometriosis   . Female hypogonadism   . Generalized anxiety disorder   . GERD (gastroesophageal reflux disease)   . History of chicken pox   . Low testosterone level in female    05/08/2014  . Seasonal allergies   . Tobacco use   . Tubular adenoma 05/20/2017    Patient Active Problem List   Diagnosis Date Noted  . Bright red blood per rectum   . Colitis 04/07/2017  . Rectal bleeding 04/07/2017  . Nausea without vomiting 10/06/2015  . Marijuana use 10/06/2015  . Hypertriglyceridemia 05/12/2015  . Vitamin D deficiency 05/12/2015  . Bipolar disorder, current episode mixed, moderate ([place])   . Bipolar disorder ([place]) 04/26/2015  . Encounter to establish care 04/24/2015  . Abdominal pain 04/24/2015  . Fatigue 04/24/2015  . Diarrhea 04/24/2015  . Fecal incontinence 04/24/2015  . Healthcare maintenance 04/24/2015  . Anxiety   . Depression   . Endometriosis   . Tobacco use     Past Surgical History:  Procedure Laterality Date  . ABDOMINAL HYSTERECTOMY  10/23/08  . ABDOMINOPLASTY    . BARTHOLIN GLAND CYST EXCISION    . BIOPSY  05/09/2017   Procedure: BIOPSY;  Surgeon: Danie Binder, MD;  [facility];  Service: Endoscopy;;  random colon;  . CESAREAN SECTION    . CHOLECYSTECTOMY N/A 01/27/2014   Procedure: LAPAROSCOPIC CHOLECYSTECTOMY;  Surgeon: [name], MD;  Location: [facility] ORS;  Service: General;  Laterality: N/A;  . COLONOSCOPY WITH PROPOFOL N/A 05/09/2017   Procedure: COLONOSCOPY WITH PROPOFOL;  Surgeon: Danie Binder, MD;  Location: [facility];  Service: Endoscopy;  Laterality: N/A;     OB History    Gravida  1   Para  1   Term      Preterm      AB      Living  2     SAB      TAB      Ectopic      Multiple  1   Live Births           Obstetric Comments  Has 3 stepchildren         Home Medications    Prior to Admission medications   Medication Sig Start Date End Date Taking? Authorizing Provider  cariprazine (VRAYLAR) capsule Take 1.5 mg by mouth daily.    [provider]  clonazePAM (KLONOPIN) 1 MG tablet Take 1 tablet by mouth 2 (two) times daily as needed for anxiety. 03/29/17   [provider]  cloNIDine (  CATAPRES) 0.1 MG tablet Take 1 tablet by mouth 2 (two) times daily as needed (BP).  03/29/17   [provider]  Estradiol-Estriol-Progesterone (BIEST/PROGESTERONE TD) Place onto the skin 2 (two) times daily. 4 clicks applied to wither forearm daily (Biest 0.23md (8/2)/ Progest 20/80ml    [provider]  lamoTRIgine (LAMICTAL) 200 MG tablet Take 200 mg by mouth daily.  01/01/17   [provider]  zolpidem (AMBIEN) 5 MG tablet Take 5 mg by mouth at bedtime.  09/30/15   [provider]    Family History Family History  Problem Relation Age of Onset  . Osteoporosis Mother   . Arthritis Mother        OA  . Stroke Mother   . Hypertension Mother   . Heart disease Father   . Osteoporosis Father   . Alzheimer's disease Father   . Diabetes Brother   . Colon cancer Maternal Grandmother   . Cancer Maternal Grandmother   .  Alzheimer's disease Paternal Grandmother   . Multiple sclerosis Brother   . Stroke Brother   . Inflammatory bowel disease Neg Hx   . Crohn's disease Neg Hx     Social History Social History   Tobacco Use  . Smoking status: Current Every Day Smoker    Packs/day: 0.25    Years: 20.00    Pack years: 5.00    Types: Cigarettes  . Smokeless tobacco: Never Used  Substance Use Topics  . Alcohol use: No    Comment: beer occ  . Drug use: Yes    Frequency: 7.0 times per week    Types: Marijuana    Comment: started with abdominal pain     Allergies   Patient has no known allergies.   Review of Systems Review of Systems  Skin: Positive for rash.  All other systems reviewed and are negative.    Physical Exam Updated Vital Signs BP (!) 146/74 (BP Location: Left Arm)   Pulse 77   Temp 98.1 F (36.7 C) (Oral)   Resp 16   Ht 5\' 5"  (1.651 m)   Wt 65.8 kg   SpO2 100%   BMI 24.13 kg/m   Physical Exam Vitals signs and nursing note reviewed.  Constitutional:      Appearance: She is well-developed.  HENT:     Head: Normocephalic.  Neck:     Musculoskeletal: Normal range of motion.  Cardiovascular:     Rate and Rhythm: Normal rate.  Pulmonary:     Effort: Pulmonary effort is normal.  Abdominal:     General: There is no distension.  Musculoskeletal: Normal range of motion.  Skin:    Comments: Fine rash arms, hands, redness under eyes  Neurological:     Mental Status: She is alert and oriented to person, place, and time.      ED Treatments / Results  Labs (all labs ordered are listed, but only abnormal results are displayed) Labs Reviewed - No data to display  EKG None  Radiology No results found.  Procedures Procedures (including critical care time)  Medications Ordered in ED Medications - No data to display   Initial Impression / Assessment and Plan / ED Course  I have reviewed the triage vital signs and the nursing notes.  Pertinent labs &  imaging results that were available during my care of the patient were reviewed by me and considered in my medical decision making (see chart for details).        MDM  Pt thinks she got into poison sumac.  Pt given rx for prednisone  Final Clinical Impressions(s) / ED Diagnoses   Final diagnoses:  Poison ivy    ED Discharge Orders    None    An After Visit Summary was printed and given to the patient.    Fransico Meadow, PA-C 11/21/18 [place], [place], DO 11/24/18 1646

## 2018-11-21 NOTE — ED Triage Notes (Signed)
Pt reports rash and swelling around eyes for past few days.  Was here a few days ago for same.

## 2021-07-26 ENCOUNTER — Encounter: Payer: Self-pay | Admitting: Family Medicine

## 2021-07-27 ENCOUNTER — Encounter: Payer: Self-pay | Admitting: Family Medicine

## 2021-07-27 ENCOUNTER — Other Ambulatory Visit: Payer: Self-pay | Admitting: Family Medicine

## 2021-07-27 ENCOUNTER — Ambulatory Visit (INDEPENDENT_AMBULATORY_CARE_PROVIDER_SITE_OTHER): Admitting: Family Medicine

## 2021-07-27 ENCOUNTER — Other Ambulatory Visit: Payer: Self-pay

## 2021-07-27 VITALS — BP 105/73 | HR 89 | Temp 98.1°F | Ht 64.5 in | Wt 134.0 lb

## 2021-07-27 DIAGNOSIS — Z114 Encounter for screening for human immunodeficiency virus [HIV]: Secondary | ICD-10-CM

## 2021-07-27 DIAGNOSIS — Z0000 Encounter for general adult medical examination without abnormal findings: Secondary | ICD-10-CM

## 2021-07-27 DIAGNOSIS — Z1159 Encounter for screening for other viral diseases: Secondary | ICD-10-CM

## 2021-07-27 DIAGNOSIS — E781 Pure hyperglyceridemia: Secondary | ICD-10-CM | POA: Diagnosis not present

## 2021-07-27 DIAGNOSIS — Z131 Encounter for screening for diabetes mellitus: Secondary | ICD-10-CM | POA: Diagnosis not present

## 2021-07-27 DIAGNOSIS — D126 Benign neoplasm of colon, unspecified: Secondary | ICD-10-CM

## 2021-07-27 DIAGNOSIS — E559 Vitamin D deficiency, unspecified: Secondary | ICD-10-CM

## 2021-07-27 NOTE — Progress Notes (Signed)
Patient ID: Samantha Lyons, female  DOB: October 24, 1970, 51 y.o.   MRN: [id number] Patient Care Team    Relationship Specialty Notifications Start End  Samantha Hillock, DO PCP - General Family Medicine  07/27/21   [facility] Consulting Physician Gastroenterology  07/05/15   Samantha Queen, MD Consulting Physician Obstetrics and Gynecology  07/27/21     Chief Complaint  Patient presents with   Establish Care   Annual Exam    Pt is not fasting    Subjective: Samantha Lyons is a 51 y.o.  female present for Re-establishment. All past medical history, surgical history, allergies, family history, immunizations, medications and social history were updated in the electronic medical record today. All recent labs, ED visits and hospitalizations within the last year were reviewed.  Health maintenance:  Colonoscopy: completed 2018, by Dr. Oneida Alar f/u 3 yrs > referred Mammogram: completed: at gyn- records requeste Cervical cancer screening: Dr. Monica Becton - records requested Immunizations: tdap UTD 2013, Influenza declined (encouraged yearly), shingrix declined, covid counseled.  Infectious disease screening: HIV and Hep C screening today DEXA:routine screen at 61 Assistive device: none Oxygen HYI:FOYD Patient has a Dental home. Hospitalizations/ED visits:reviewed  No flowsheet data found. No flowsheet data found.      No flowsheet data found.   Immunization History  Administered Date(s) Administered   Influenza,inj,Quad PF,6+ Mos 04/24/2015   Tdap 12/07/2011    No results found.  Past Medical History:  Diagnosis Date   Bipolar disorder, unspecified ([place])    Bright red blood per rectum    Cholelithiasis    Chronic fatigue syndrome    Colitis    Depression    Eating disorder    Endometriosis    Fecal incontinence 04/24/2015   Female hypogonadism    Generalized anxiety disorder    GERD (gastroesophageal reflux disease)    History of chicken pox     Low testosterone level in female    05/08/2014   Marijuana use 10/06/2015   - Positive THC in ED - THC negative 10/07/2015, UDS negative   Seasonal allergies    Tobacco use    Tubular adenoma 05/20/2017   No Known Allergies Past Surgical History:  Procedure Laterality Date   ABDOMINAL HYSTERECTOMY  10/23/2008   ABDOMINOPLASTY     BARTHOLIN GLAND CYST EXCISION     BIOPSY  05/09/2017   Procedure: BIOPSY;  Surgeon: Samantha Binder, MD;  Location: [facility];  Service: Endoscopy;;  random colon;   CESAREAN SECTION  2000   CHOLECYSTECTOMY N/A 01/27/2014   Procedure: LAPAROSCOPIC CHOLECYSTECTOMY;  Surgeon: Samantha So, MD;  Location: [facility];  Service: General;  Laterality: N/A;   COLONOSCOPY WITH PROPOFOL N/A 05/09/2017   Procedure: COLONOSCOPY WITH PROPOFOL;  Surgeon: Samantha Binder, MD;  Location: [facility];  Service: Endoscopy;  Laterality: N/A;   TONSILLECTOMY     Family History  Problem Relation Age of Onset   Osteoporosis Mother    Arthritis Mother        OA   Stroke Mother    Hypertension Mother    Depression Mother    Miscarriages / [place] Mother    Hypertension Father    Heart disease Father    Osteoporosis Father    Alzheimer's disease Father    Hepatitis Brother    Multiple sclerosis Brother    Stroke Brother    Stomach cancer Brother    Colon cancer Maternal Grandmother    Alzheimer's disease  Paternal Grandmother    Inflammatory bowel disease Neg Hx    Crohn's disease Neg Hx    Social History   Social History Narrative   Married. High school grad.   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    Smoker. Herbal remedies, seat belts.   Smoke alarm in the home.    Guns in the home (in a locked cabinet)   Feels safe in relationships    Allergies as of 07/27/2021   No Known Allergies      Medication List        Accurate as of July 27, 2021  2:07 PM. If you have any questions, ask your nurse or doctor.          STOP  taking these medications    BIEST/PROGESTERONE TD Stopped by: Howard Pouch, DO   buPROPion 150 MG 24 hr tablet Commonly known as: WELLBUTRIN XL Stopped by: Howard Pouch, DO   predniSONE 10 MG tablet Commonly known as: DELTASONE Stopped by: Howard Pouch, DO       TAKE these medications    atomoxetine 100 MG capsule Commonly known as: STRATTERA   cariprazine 1.5 MG capsule Commonly known as: VRAYLAR Take 1.5 mg by mouth daily.   clonazePAM 1 MG tablet Commonly known as: KLONOPIN Take 1 tablet by mouth 2 (two) times daily as needed for anxiety.   cloNIDine 0.1 MG tablet Commonly known as: CATAPRES Take 1 tablet by mouth 2 (two) times daily as needed (BP).   estradiol 0.5 MG tablet Commonly known as: ESTRACE   hydrOXYzine 25 MG capsule Commonly known as: VISTARIL Take 25 mg by mouth at bedtime.   lamoTRIgine 200 MG tablet Commonly known as: LAMICTAL Take 200 mg by mouth daily.   progesterone 100 MG capsule Commonly known as: PROMETRIUM Take 200 mg by mouth daily.   zolpidem 5 MG tablet Commonly known as: AMBIEN Take 5 mg by mouth at bedtime.        All past medical history, surgical history, allergies, family history, immunizations andmedications were updated in the EMR today and reviewed under the history and medication portions of their EMR.    No results found for this or any previous visit (from the past 2160 hour(s)).  No results found.   ROS 14 pt review of systems performed and negative (unless mentioned in an HPI)  Objective:  BP 105/73    Pulse 89    Temp 98.1 F (36.7 C) (Oral)    Ht 5' 4.5" (1.638 m)    Wt 134 lb (60.8 kg)    SpO2 97%    BMI 22.65 kg/m  Physical Exam Vitals and nursing note reviewed.  Constitutional:      General: She is not in acute distress.    Appearance: Normal appearance. She is not ill-appearing or toxic-appearing.  HENT:     Head: Normocephalic and atraumatic.     Right Ear: Tympanic membrane, ear canal and  external ear normal. There is no impacted cerumen.     Left Ear: Tympanic membrane, ear canal and external ear normal. There is no impacted cerumen.     Nose: No congestion or rhinorrhea.     Mouth/Throat:     Mouth: Mucous membranes are moist.     Pharynx: Oropharynx is clear. No oropharyngeal exudate or posterior oropharyngeal erythema.  Eyes:     General: No scleral icterus.       Right eye: No discharge.  Left eye: No discharge.     Extraocular Movements: Extraocular movements intact.     Conjunctiva/sclera: Conjunctivae normal.     Pupils: Pupils are equal, round, and reactive to light.  Cardiovascular:     Rate and Rhythm: Normal rate and regular rhythm.     Pulses: Normal pulses.     Heart sounds: Normal heart sounds. No murmur heard.   No friction rub. No gallop.  Pulmonary:     Effort: Pulmonary effort is normal. No respiratory distress.     Breath sounds: Normal breath sounds. No stridor. No wheezing, rhonchi or rales.  Chest:     Chest wall: No tenderness.  Abdominal:     General: Abdomen is flat. Bowel sounds are normal. There is no distension.     Palpations: Abdomen is soft. There is no mass.     Tenderness: There is no abdominal tenderness. There is no right CVA tenderness, left CVA tenderness, guarding or rebound.     Hernia: No hernia is present.  Musculoskeletal:        General: No swelling, tenderness or deformity. Normal range of motion.     Cervical back: Normal range of motion and neck supple. No rigidity or tenderness.     Right lower leg: No edema.     Left lower leg: No edema.  Lymphadenopathy:     Cervical: No cervical adenopathy.  Skin:    General: Skin is warm and dry.     Coloration: Skin is not jaundiced or pale.     Findings: No bruising, erythema, lesion or rash.  Neurological:     General: No focal deficit present.     Mental Status: She is alert and oriented to person, place, and time. Mental status is at baseline.     Cranial Nerves:  No cranial nerve deficit.     Sensory: No sensory deficit.     Motor: No weakness.     Coordination: Coordination normal.     Gait: Gait normal.     Deep Tendon Reflexes: Reflexes normal.  Psychiatric:        Mood and Affect: Mood normal.        Behavior: Behavior normal.        Thought Content: Thought content normal.        Judgment: Judgment normal.     Assessment/plan: BLAYKLEE MABLE is a 51 y.o. female present for  Hypertriglyceridemia - CBC; Future - Comp Met (CMET); Future - TSH; Future - Lipid panel; Future Vitamin D deficiency - Vitamin D (25 hydroxy); Future Diabetes mellitus screening - Hemoglobin A1c; Future Encounter for screening for HIV - HIV antibody (with reflex); Future Need for hepatitis C screening test - Hepatitis C Antibody; Future Adenomatous polyp of colon, unspecified part of colon - Ambulatory referral to Gastroenterology Routine general medical examination at a health care facility Colonoscopy: completed 2018, by Dr. Oneida Alar f/u 3 yrs > referred Mammogram: completed: at gyn- records requeste Cervical cancer screening: Dr. Monica Becton - records requested Immunizations: tdap UTD 2013, Influenza declined (encouraged yearly), shingrix declined, covid counseled.  Infectious disease screening: HIV and Hep C screening today DEXA:routine screen at 61 Patient was encouraged to exercise greater than 150 minutes a week. Patient was encouraged to choose a diet filled with fresh fruits and vegetables, and lean meats. AVS provided to patient today for education/recommendation on gender specific health and safety maintenance.  Return in about 1 year (around 07/28/2022) for CPE (30 min).  Orders Placed This Encounter  Procedures   CBC   Comp Met (CMET)   TSH   Lipid panel   Hemoglobin A1c   HIV antibody (with reflex)   Hepatitis C Antibody   Vitamin D (25 hydroxy)   Ambulatory referral to Gastroenterology   No orders of the defined types were placed in this  encounter.  Referral Orders         Ambulatory referral to Gastroenterology       Note is dictated utilizing voice recognition software. Although note has been proof read prior to signing, occasional typographical errors still can be missed. If any questions arise, please do not hesitate to call for verification.  Electronically signed by: Howard Pouch, DO [place]

## 2021-07-27 NOTE — Patient Instructions (Signed)
°Great to see you today.  °I have refilled the medication(s) we provide.  ° °If labs were collected, we will inform you of lab results once received either by echart message or telephone call.  ° - echart message- for normal results that have been seen by the patient already.  ° - telephone call: abnormal results or if patient has not viewed results in their echart. ° °Health Maintenance, Female °Adopting a healthy lifestyle and getting preventive care are important in promoting health and wellness. Ask your health care provider about: °The right schedule for you to have regular tests and exams. °Things you can do on your own to prevent diseases and keep yourself healthy. °What should I know about diet, weight, and exercise? °Eat a healthy diet ° °Eat a diet that includes plenty of vegetables, fruits, low-fat dairy products, and lean protein. °Do not eat a lot of foods that are high in solid fats, added sugars, or sodium. °Maintain a healthy weight °Body mass index (BMI) is used to identify weight problems. It estimates body fat based on height and weight. Your health care provider can help determine your BMI and help you achieve or maintain a healthy weight. °Get regular exercise °Get regular exercise. This is one of the most important things you can do for your health. Most adults should: °Exercise for at least 150 minutes each week. The exercise should increase your heart rate and make you sweat (moderate-intensity exercise). °Do strengthening exercises at least twice a week. This is in addition to the moderate-intensity exercise. °Spend less time sitting. Even light physical activity can be beneficial. °Watch cholesterol and blood lipids °Have your blood tested for lipids and cholesterol at 50 years of age, then have this test every 5 years. °Have your cholesterol levels checked more often if: °Your lipid or cholesterol levels are high. °You are older than 50 years of age. °You are at high risk for heart  disease. °What should I know about cancer screening? °Depending on your health history and family history, you may need to have cancer screening at various ages. This may include screening for: °Breast cancer. °Cervical cancer. °Colorectal cancer. °Skin cancer. °Lung cancer. °What should I know about heart disease, diabetes, and high blood pressure? °Blood pressure and heart disease °High blood pressure causes heart disease and increases the risk of stroke. This is more likely to develop in people who have high blood pressure readings or are overweight. °Have your blood pressure checked: °Every 3-5 years if you are 18-39 years of age. °Every year if you are 40 years old or older. °Diabetes °Have regular diabetes screenings. This checks your fasting blood sugar level. Have the screening done: °Once every three years after age 40 if you are at a normal weight and have a low risk for diabetes. °More often and at a younger age if you are overweight or have a high risk for diabetes. °What should I know about preventing infection? °Hepatitis B °If you have a higher risk for hepatitis B, you should be screened for this virus. Talk with your health care provider to find out if you are at risk for hepatitis B infection. °Hepatitis C °Testing is recommended for: °Everyone born from 1945 through 1965. °Anyone with known risk factors for hepatitis C. °Sexually transmitted infections (STIs) °Get screened for STIs, including gonorrhea and chlamydia, if: °You are sexually active and are younger than 50 years of age. °You are older than 50 years of age and your health care provider   tells you that you are at risk for this type of infection. °Your sexual activity has changed since you were last screened, and you are at increased risk for chlamydia or gonorrhea. Ask your health care provider if you are at risk. °Ask your health care provider about whether you are at high risk for HIV. Your health care provider may recommend a  prescription medicine to help prevent HIV infection. If you choose to take medicine to prevent HIV, you should first get tested for HIV. You should then be tested every 3 months for as long as you are taking the medicine. °Pregnancy °If you are about to stop having your period (premenopausal) and you may become pregnant, seek counseling before you get pregnant. °Take 400 to 800 micrograms (mcg) of folic acid every day if you become pregnant. °Ask for birth control (contraception) if you want to prevent pregnancy. °Osteoporosis and menopause °Osteoporosis is a disease in which the bones lose minerals and strength with aging. This can result in bone fractures. If you are 65 years old or older, or if you are at risk for osteoporosis and fractures, ask your health care provider if you should: °Be screened for bone loss. °Take a calcium or vitamin D supplement to lower your risk of fractures. °Be given hormone replacement therapy (HRT) to treat symptoms of menopause. °Follow these instructions at home: °Alcohol use °Do not drink alcohol if: °Your health care provider tells you not to drink. °You are pregnant, may be pregnant, or are planning to become pregnant. °If you drink alcohol: °Limit how much you have to: °0-1 drink a day. °Know how much alcohol is in your drink. In the [place] one drink equals one 12 oz bottle of beer (355 mL), one 5 oz glass of wine (148 mL), or one 1½ oz glass of hard liquor (44 mL). °Lifestyle °Do not use any products that contain nicotine or tobacco. These products include cigarettes, chewing tobacco, and vaping devices, such as e-cigarettes. If you need help quitting, ask your health care provider. °Do not use street drugs. °Do not share needles. °Ask your health care provider for help if you need support or information about quitting drugs. °General instructions °Schedule regular health, dental, and eye exams. °Stay current with your vaccines. °Tell your health care provider if: °You often  feel depressed. °You have ever been abused or do not feel safe at home. °Summary °Adopting a healthy lifestyle and getting preventive care are important in promoting health and wellness. °Follow your health care provider's instructions about healthy diet, exercising, and getting tested or screened for diseases. °Follow your health care provider's instructions on monitoring your cholesterol and blood pressure. °This information is not intended to replace advice given to you by your health care provider. Make sure you discuss any questions you have with your health care provider. °Document Revised: 11/09/2020 Document Reviewed: 11/09/2020 °Elsevier Patient Education © 2022 [employer] ° °

## 2021-07-30 ENCOUNTER — Other Ambulatory Visit (INDEPENDENT_AMBULATORY_CARE_PROVIDER_SITE_OTHER)

## 2021-07-30 ENCOUNTER — Other Ambulatory Visit: Payer: Self-pay

## 2021-07-30 DIAGNOSIS — E781 Pure hyperglyceridemia: Secondary | ICD-10-CM

## 2021-07-30 DIAGNOSIS — Z1159 Encounter for screening for other viral diseases: Secondary | ICD-10-CM

## 2021-07-30 DIAGNOSIS — Z114 Encounter for screening for human immunodeficiency virus [HIV]: Secondary | ICD-10-CM

## 2021-07-30 DIAGNOSIS — Z131 Encounter for screening for diabetes mellitus: Secondary | ICD-10-CM

## 2021-07-30 DIAGNOSIS — E559 Vitamin D deficiency, unspecified: Secondary | ICD-10-CM | POA: Diagnosis not present

## 2021-07-30 LAB — COMPREHENSIVE METABOLIC PANEL
ALT: 13 U/L (ref 0–35)
AST: 17 U/L (ref 0–37)
Albumin: 4.5 g/dL (ref 3.5–5.2)
Alkaline Phosphatase: 65 U/L (ref 39–117)
BUN: 7 mg/dL (ref 6–23)
CO2: 26 mEq/L (ref 19–32)
Calcium: 9.8 mg/dL (ref 8.4–10.5)
Chloride: 103 mEq/L (ref 96–112)
Creatinine, Ser: 1.04 mg/dL (ref 0.40–1.20)
GFR: 62.86 mL/min (ref >60.00)
Glucose, Bld: 104 mg/dL — ABNORMAL HIGH (ref 70–99)
Potassium: 4.1 mEq/L (ref 3.5–5.1)
Sodium: 138 mEq/L (ref 135–145)
Total Bilirubin: 0.5 mg/dL (ref 0.2–1.2)
Total Protein: 7.1 g/dL (ref 6.0–8.3)

## 2021-07-30 LAB — TSH: TSH: 3.19 u[IU]/mL (ref 0.35–5.50)

## 2021-07-30 LAB — CBC
HCT: 43.1 % (ref 36.0–46.0)
Hemoglobin: 14.5 g/dL (ref 12.0–15.0)
MCHC: 33.6 g/dL (ref 30.0–36.0)
MCV: 85.3 fl (ref 78.0–100.0)
Platelets: 428 10*3/uL — ABNORMAL HIGH (ref 150.0–400.0)
RBC: 5.05 Mil/uL (ref 3.87–5.11)
RDW: 12.7 % (ref 11.5–15.5)
WBC: 8.5 10*3/uL (ref 4.0–10.5)

## 2021-07-30 LAB — LIPID PANEL
Cholesterol: 178 mg/dL (ref 0–200)
HDL: 71.1 mg/dL (ref >39.00)
LDL Cholesterol: 80 mg/dL (ref 0–99)
NonHDL: 106.75
Total CHOL/HDL Ratio: 3
Triglycerides: 132 mg/dL (ref 0.0–149.0)
VLDL: 26.4 mg/dL (ref 0.0–40.0)

## 2021-07-30 LAB — VITAMIN D 25 HYDROXY (VIT D DEFICIENCY, FRACTURES): VITD: 23.28 ng/mL — ABNORMAL LOW (ref 30.00–100.00)

## 2021-07-30 NOTE — Progress Notes (Signed)
Per the orders of Dr. Raoul Pitch pt is here for labs pt tolerated draw well.

## 2021-08-02 LAB — HEPATITIS C ANTIBODY
Hepatitis C Ab: NONREACTIVE
SIGNAL TO CUT-OFF: <0.02 (ref <1.00)

## 2021-08-02 LAB — HIV ANTIBODY (ROUTINE TESTING W REFLEX): HIV 1&2 Ab, 4th Generation: NONREACTIVE

## 2021-08-02 LAB — HEMOGLOBIN A1C: Hgb A1c MFr Bld: 5.2 % (ref 4.6–6.5)

## 2021-08-17 ENCOUNTER — Other Ambulatory Visit: Payer: Self-pay | Admitting: Obstetrics and Gynecology

## 2021-08-17 DIAGNOSIS — R928 Other abnormal and inconclusive findings on diagnostic imaging of breast: Secondary | ICD-10-CM

## 2021-09-06 ENCOUNTER — Ambulatory Visit
Admission: RE | Admit: 2021-09-06 | Discharge: 2021-09-06 | Disposition: A | Discharge status: Home / Self Care | Source: Ambulatory Visit | Attending: Obstetrics and Gynecology | Admitting: Obstetrics and Gynecology

## 2021-09-06 ENCOUNTER — Ambulatory Visit

## 2021-09-06 DIAGNOSIS — R928 Other abnormal and inconclusive findings on diagnostic imaging of breast: Secondary | ICD-10-CM

## 2022-05-04 ENCOUNTER — Encounter: Payer: Self-pay | Admitting: *Deleted

## 2022-05-17 ENCOUNTER — Telehealth: Payer: Self-pay | Admitting: Family Medicine

## 2022-05-18 NOTE — Telephone Encounter (Signed)
Error

## 2022-07-29 ENCOUNTER — Encounter: Payer: Self-pay | Admitting: Family Medicine

## 2022-07-29 ENCOUNTER — Ambulatory Visit (INDEPENDENT_AMBULATORY_CARE_PROVIDER_SITE_OTHER): Admitting: Family Medicine

## 2022-07-29 VITALS — BP 119/85 | HR 81 | Temp 98.0°F | Ht 65.75 in | Wt 135.6 lb

## 2022-07-29 DIAGNOSIS — E781 Pure hyperglyceridemia: Secondary | ICD-10-CM | POA: Diagnosis not present

## 2022-07-29 DIAGNOSIS — Z0000 Encounter for general adult medical examination without abnormal findings: Secondary | ICD-10-CM

## 2022-07-29 DIAGNOSIS — Z23 Encounter for immunization: Secondary | ICD-10-CM

## 2022-07-29 DIAGNOSIS — Z79899 Other long term (current) drug therapy: Secondary | ICD-10-CM

## 2022-07-29 DIAGNOSIS — E559 Vitamin D deficiency, unspecified: Secondary | ICD-10-CM

## 2022-07-29 DIAGNOSIS — K635 Polyp of colon: Secondary | ICD-10-CM

## 2022-07-29 DIAGNOSIS — Z1231 Encounter for screening mammogram for malignant neoplasm of breast: Secondary | ICD-10-CM

## 2022-07-29 NOTE — Patient Instructions (Signed)
Return in about 1 year (around 07/31/2023) for cpe (20 min).        Great to see you today.  I have refilled the medication(s) we provide.   If labs were collected, we will inform you of lab results once received either by echart message or telephone call.   - echart message- for normal results that have been seen by the patient already.   - telephone call: abnormal results or if patient has not viewed results in their echart.

## 2022-07-29 NOTE — Progress Notes (Signed)
Patient ID: Samantha Lyons, female  DOB: 1970-07-26, 52 y.o.   MRN: [id number] Patient Care Team    Relationship Specialty Notifications Start End  Ma Hillock, DO PCP - General Family Medicine  07/27/21   [facility] Consulting Physician Gastroenterology  07/05/15   Dian Queen, MD Consulting Physician Obstetrics and Gynecology  07/27/21     Chief Complaint  Patient presents with   Annual Exam    Pt is fasting    Subjective: Samantha Lyons is a 52 y.o.  female present for CPE All past medical history, surgical history, allergies, family history, immunizations, medications and social history were updated in the electronic medical record today. All recent labs, ED visits and hospitalizations within the last year were reviewed.  Health maintenance:  Colonoscopy: completed 2018, by Dr. Oneida Alar f/u 3 yrs > referred last yr>referred again wants [place] area Mammogram: completed: at gyn- records requeste Cervical cancer screening: Dr. Monica Becton - records requested Immunizations: tdap declined, Influenza declined (encouraged yearly), shingrix declined Infectious disease screening: HIV and Hep C screening today DEXA:routine screen at 61 Assistive device: none Oxygen VOJ:JKKX Patient has a Dental home. Hospitalizations/ED visits:reviewed      No data to display             No data to display                  No data to display           Immunization History  Administered Date(s) Administered   Influenza,inj,Quad PF,6+ Mos 04/24/2015   Tdap 12/07/2011    No results found.  Past Medical History:  Diagnosis Date   Bipolar disorder, unspecified ([place])    Bright red blood per rectum    Cholelithiasis    Chronic fatigue syndrome    Colitis    Depression    Eating disorder    Endometriosis    Fecal incontinence 04/24/2015   Female hypogonadism    Generalized anxiety disorder    GERD (gastroesophageal reflux disease)     History of chicken pox    Low testosterone level in female    05/08/2014   Marijuana use 10/06/2015   - Positive THC in ED - THC negative 10/07/2015, UDS negative   Seasonal allergies    Tobacco use    Tubular adenoma 05/20/2017   No Known Allergies Past Surgical History:  Procedure Laterality Date   ABDOMINAL HYSTERECTOMY  10/23/2008   ABDOMINOPLASTY     BARTHOLIN GLAND CYST EXCISION     BIOPSY  05/09/2017   Procedure: BIOPSY;  Surgeon: Danie Binder, MD;  [facility];  Service: Endoscopy;;  random colon;   CESAREAN SECTION  2000   CHOLECYSTECTOMY N/A 01/27/2014   Procedure: LAPAROSCOPIC CHOLECYSTECTOMY;  Surgeon: [name], MD;  Location: [facility];  Service: General;  Laterality: N/A;   COLONOSCOPY WITH PROPOFOL N/A 05/09/2017   Procedure: COLONOSCOPY WITH PROPOFOL;  Surgeon: Danie Binder, MD;  [facility];  Service: Endoscopy;  Laterality: N/A;   TONSILLECTOMY     Family History  Problem Relation Age of Onset   Osteoporosis Mother    Arthritis Mother        OA   Stroke Mother    Hypertension Mother    Depression Mother    Miscarriages / Stillbirths Mother    Hypertension Father    Heart disease Father    Osteoporosis Father    Alzheimer's disease Father  Hepatitis Brother    Multiple sclerosis Brother    Stroke Brother    Stomach cancer Brother    Colon cancer Maternal Grandmother    Alzheimer's disease Paternal Grandmother    Inflammatory bowel disease Neg Hx    Crohn's disease Neg Hx    Social History   Social History Narrative   Married. High school grad.   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    Smoker. Herbal remedies, seat belts.   Smoke alarm in the home.    Guns in the home (in a locked cabinet)   Feels safe in relationships    Allergies as of 07/29/2022   No Known Allergies      Medication List        Accurate as of July 29, 2022  2:34 PM. If you have any questions, ask your nurse or  doctor.          STOP taking these medications    cloNIDine 0.1 MG tablet Commonly known as: CATAPRES Stopped by: Howard Pouch, DO   hydrOXYzine 25 MG capsule Commonly known as: VISTARIL Stopped by: Howard Pouch, DO   lamoTRIgine 200 MG tablet Commonly known as: LAMICTAL Stopped by: Howard Pouch, DO   zolpidem 10 MG tablet Commonly known as: AMBIEN Stopped by: Howard Pouch, DO   zolpidem 5 MG tablet Commonly known as: AMBIEN Stopped by: Howard Pouch, DO       TAKE these medications    atomoxetine 100 MG capsule Commonly known as: STRATTERA   clonazePAM 1 MG tablet Commonly known as: KLONOPIN Take 1 tablet by mouth 2 (two) times daily as needed for anxiety.   cloNIDine HCl 0.1 MG Tb12 ER tablet Commonly known as: KAPVAY PLEASE SEE ATTACHED FOR DETAILED DIRECTIONS   estradiol 0.5 MG tablet Commonly known as: ESTRACE   gabapentin 100 MG capsule Commonly known as: NEURONTIN 1 TO 3 AT BEDTIME AS NEEDED INSOMNIA   hydrOXYzine 10 MG tablet Commonly known as: ATARAX TAKE 1 TABLET UP TO TWICE DAILY AS NEEDED FOR ANXIETY   INTUNIV PO one qd   progesterone 100 MG capsule Commonly known as: PROMETRIUM Take 200 mg by mouth daily.   traZODone 100 MG tablet Commonly known as: DESYREL TAKE 1 TABLET BY MOUTH EVERYDAY AT BEDTIME   Vraylar 3 MG capsule Generic drug: cariprazine Take 3 mg by mouth at bedtime. What changed: Another medication with the same name was removed. Continue taking this medication, and follow the directions you see here. Changed by: Howard Pouch, DO        All past medical history, surgical history, allergies, family history, immunizations andmedications were updated in the EMR today and reviewed under the history and medication portions of their EMR.    No results found for this or any previous visit (from the past 2160 hour(s)).  No results found.   ROS 14 pt review of systems performed and negative (unless mentioned in an  HPI)  Objective: BP 119/85   Pulse 81   Temp 98 F (36.7 C)   Ht 5' 5.75" (1.67 m)   Wt 135 lb 9.6 oz (61.5 kg)   SpO2 97%   BMI 22.05 kg/m  Physical Exam Vitals and nursing note reviewed.  Constitutional:      General: She is not in acute distress.    Appearance: Normal appearance. She is not ill-appearing or toxic-appearing.  HENT:     Head: Normocephalic and atraumatic.     Right Ear: Tympanic membrane,  ear canal and external ear normal. There is no impacted cerumen.     Left Ear: Tympanic membrane, ear canal and external ear normal. There is no impacted cerumen.     Nose: No congestion or rhinorrhea.     Mouth/Throat:     Mouth: Mucous membranes are moist.     Pharynx: Oropharynx is clear. No oropharyngeal exudate or posterior oropharyngeal erythema.  Eyes:     General: No scleral icterus.       Right eye: No discharge.        Left eye: No discharge.     Extraocular Movements: Extraocular movements intact.     Conjunctiva/sclera: Conjunctivae normal.     Pupils: Pupils are equal, round, and reactive to light.  Cardiovascular:     Rate and Rhythm: Normal rate and regular rhythm.     Pulses: Normal pulses.     Heart sounds: Normal heart sounds. No murmur heard.    No friction rub. No gallop.  Pulmonary:     Effort: Pulmonary effort is normal. No respiratory distress.     Breath sounds: Normal breath sounds. No stridor. No wheezing, rhonchi or rales.  Chest:     Chest wall: No tenderness.  Abdominal:     General: Abdomen is flat. Bowel sounds are normal. There is no distension.     Palpations: Abdomen is soft. There is no mass.     Tenderness: There is no abdominal tenderness. There is no right CVA tenderness, left CVA tenderness, guarding or rebound.     Hernia: No hernia is present.  Musculoskeletal:        General: No swelling, tenderness or deformity. Normal range of motion.     Cervical back: Normal range of motion and neck supple. No rigidity or tenderness.      Right lower leg: No edema.     Left lower leg: No edema.  Lymphadenopathy:     Cervical: No cervical adenopathy.  Skin:    General: Skin is warm and dry.     Coloration: Skin is not jaundiced or pale.     Findings: No bruising, erythema, lesion or rash.  Neurological:     General: No focal deficit present.     Mental Status: She is alert and oriented to person, place, and time. Mental status is at baseline.     Cranial Nerves: No cranial nerve deficit.     Sensory: No sensory deficit.     Motor: No weakness.     Coordination: Coordination normal.     Gait: Gait normal.     Deep Tendon Reflexes: Reflexes normal.  Psychiatric:        Mood and Affect: Mood normal.        Behavior: Behavior normal.        Thought Content: Thought content normal.        Judgment: Judgment normal.      Assessment/plan: Samantha Lyons is a 52 y.o. female present for CPE Hypertriglyceridemia - CBC - Comprehensive metabolic panel - Lipid panel Vitamin D deficiency - VITAMIN D 25 Hydroxy (Vit-D Deficiency, Fractures) Encounter for long-term current use of medication - Hemoglobin A1c - TSH Need for zoster vaccination/Need for Tdap vaccination/Influenza vaccine needed declined Breast cancer screening by mammogram Has completed at gyn Polyp of colon, unspecified part of colon, unspecified type Wants GI in [facility] - Ambulatory referral to Gastroenterology Routine general medical examination at a health care facility Colonoscopy: completed 2018, by Dr. Oneida Alar f/u 3 yrs >  referred last yr>referred again wants [place] area Mammogram: completed: at gyn- records requeste Cervical cancer screening: Dr. Monica Becton - records requested Immunizations: tdap declined, Influenza declined (encouraged yearly), shingrix declined Infectious disease screening: HIV and Hep C screening today DEXA:routine screen at 61  No follow-ups on file.  Orders Placed This Encounter  Procedures   CBC   Hemoglobin A1c    Comprehensive metabolic panel   Lipid panel   TSH   VITAMIN D 25 Hydroxy (Vit-D Deficiency, Fractures)   Ambulatory referral to Gastroenterology   No orders of the defined types were placed in this encounter.  Referral Orders         Ambulatory referral to Gastroenterology        Note is dictated utilizing voice recognition software. Although note has been proof read prior to signing, occasional typographical errors still can be missed. If any questions arise, please do not hesitate to call for verification.  Electronically signed by: Howard Pouch, DO [place]

## 2022-07-30 LAB — HEMOGLOBIN A1C
Hgb A1c MFr Bld: 5.3 % of total Hgb (ref <5.7)
Mean Plasma Glucose: 105 mg/dL
eAG (mmol/L): 5.8 mmol/L

## 2022-07-30 LAB — COMPREHENSIVE METABOLIC PANEL
AG Ratio: 1.6 (calc) (ref 1.0–2.5)
ALT: 9 U/L (ref 6–29)
AST: 12 U/L (ref 10–35)
Albumin: 4.1 g/dL (ref 3.6–5.1)
Alkaline phosphatase (APISO): 83 U/L (ref 37–153)
BUN/Creatinine Ratio: 3 (calc) — ABNORMAL LOW (ref 6–22)
BUN: 3 mg/dL — ABNORMAL LOW (ref 7–25)
CO2: 20 mmol/L (ref 20–32)
Calcium: 9 mg/dL (ref 8.6–10.4)
Chloride: 103 mmol/L (ref 98–110)
Creat: 0.87 mg/dL (ref 0.50–1.03)
Globulin: 2.6 g/dL (calc) (ref 1.9–3.7)
Glucose, Bld: 95 mg/dL (ref 65–99)
Potassium: 4 mmol/L (ref 3.5–5.3)
Sodium: 136 mmol/L (ref 135–146)
Total Bilirubin: 0.3 mg/dL (ref 0.2–1.2)
Total Protein: 6.7 g/dL (ref 6.1–8.1)

## 2022-07-30 LAB — VITAMIN D 25 HYDROXY (VIT D DEFICIENCY, FRACTURES): Vit D, 25-Hydroxy: 20 ng/mL — ABNORMAL LOW (ref 30–100)

## 2022-07-30 LAB — LIPID PANEL
Cholesterol: 166 mg/dL (ref <200)
HDL: 61 mg/dL (ref >=50)
LDL Cholesterol (Calc): 81 mg/dL (calc)
Non-HDL Cholesterol (Calc): 105 mg/dL (calc) (ref <130)
Total CHOL/HDL Ratio: 2.7 (calc) (ref <5.0)
Triglycerides: 142 mg/dL (ref <150)

## 2022-07-30 LAB — CBC
HCT: 39.5 % (ref 35.0–45.0)
Hemoglobin: 13.8 g/dL (ref 11.7–15.5)
MCH: 29.5 pg (ref 27.0–33.0)
MCHC: 34.9 g/dL (ref 32.0–36.0)
MCV: 84.4 fL (ref 80.0–100.0)
MPV: 9.2 fL (ref 7.5–12.5)
Platelets: 387 10*3/uL (ref 140–400)
RBC: 4.68 10*6/uL (ref 3.80–5.10)
RDW: 12.1 % (ref 11.0–15.0)
WBC: 8.6 10*3/uL (ref 3.8–10.8)

## 2022-07-30 LAB — TSH: TSH: 3.21 mIU/L

## 2022-08-15 ENCOUNTER — Telehealth: Payer: Self-pay | Admitting: Gastroenterology

## 2022-08-15 NOTE — Telephone Encounter (Signed)
Please schedule next available appointment for colonoscopy for history of adenomatous colon polyp.  Thank you

## 2022-08-15 NOTE — Telephone Encounter (Signed)
Good Afternoon Dr. Silverio Decamp,   Supervising Provider 2/12 PM,  I have received a referral for this patient for a colonoscopy from [facility], DO. Patient has had a past colonoscopy with Marylee Floras in 2018. Patient is wishing to have her next colonoscopy here with [place] given the recommendation from her doctor, also states that she was not satisfied with the care of the staff at [facility]. Records are available to view in Epic, please review them at your earliest convenience and advise on scheduling.  Thank You!

## 2022-08-16 ENCOUNTER — Encounter: Payer: Self-pay | Admitting: Gastroenterology

## 2022-08-16 NOTE — Telephone Encounter (Signed)
Colonoscopy scheduled 4/15 at 1:30

## 2022-08-29 ENCOUNTER — Ambulatory Visit (INDEPENDENT_AMBULATORY_CARE_PROVIDER_SITE_OTHER): Admitting: Family Medicine

## 2022-08-29 DIAGNOSIS — Z91199 Patient's noncompliance with other medical treatment and regimen due to unspecified reason: Secondary | ICD-10-CM

## 2022-08-29 NOTE — Progress Notes (Signed)
No show

## 2022-09-02 ENCOUNTER — Encounter: Payer: Self-pay | Admitting: Family Medicine

## 2022-09-12 ENCOUNTER — Ambulatory Visit: Admitting: Family Medicine

## 2022-09-12 ENCOUNTER — Encounter: Payer: Self-pay | Admitting: Family Medicine

## 2022-09-12 VITALS — BP 96/63 | HR 91 | Temp 98.0°F | Wt 135.6 lb

## 2022-09-12 DIAGNOSIS — F5101 Primary insomnia: Secondary | ICD-10-CM | POA: Diagnosis not present

## 2022-09-12 DIAGNOSIS — F3162 Bipolar disorder, current episode mixed, moderate: Secondary | ICD-10-CM

## 2022-09-12 DIAGNOSIS — G479 Sleep disorder, unspecified: Secondary | ICD-10-CM

## 2022-09-12 NOTE — Progress Notes (Signed)
Samantha Lyons , August 07, 1970, 52 y.o., female MRN: [id number] Patient Care Team    Relationship Specialty Notifications Start End  Ma Hillock, DO PCP - General Family Medicine  07/27/21   [facility] Consulting Physician Gastroenterology  07/05/15   Dian Queen, MD Consulting Physician Obstetrics and Gynecology  07/27/21     Chief Complaint  Patient presents with   Request for referral    Therapist is asking pt to have sleep study    Insomnia     Subjective: Samantha Lyons is a 52 y.o. Pt presents for an OV with request of a sleep study to be completed. She reports her therapist request the study she is treating her for her insomnia with multiple medications. Patient reports she has bipolar disorder and has always required a sleep aid in order to fall asleep and stay asleep.  She had been on Ambien in the past.  She has also been tried on Seroquel with bad side effects. She is currently taking Klonopin, clonidine, gabapentin, trazodone and Lunesta.  There is a concern for an underlying sleep disorder by her therapist.     07/29/2022    2:37 PM  Depression screen PHQ 2/9  Decreased Interest 3  Down, Depressed, Hopeless 2  PHQ - 2 Score 5  Altered sleeping 3  Tired, decreased energy 0  Change in appetite 0  Feeling bad or failure about yourself  1  Trouble concentrating 1  Moving slowly or fidgety/restless 3  Suicidal thoughts 0  PHQ-9 Score 13    No Known Allergies Social History   Social History Narrative   Married. High school grad.   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    Smoker. Herbal remedies, seat belts.   Smoke alarm in the home.    Guns in the home (in a locked cabinet)   Feels safe in relationships   Past Medical History:  Diagnosis Date   Bipolar disorder, unspecified ([place])    Bright red blood per rectum    Cholelithiasis    Chronic fatigue syndrome    Colitis    Depression    Eating disorder     Endometriosis    Fecal incontinence 04/24/2015   Female hypogonadism    Generalized anxiety disorder    GERD (gastroesophageal reflux disease)    History of chicken pox    Low testosterone level in female    05/08/2014   Marijuana use 10/06/2015   - Positive THC in ED - THC negative 10/07/2015, UDS negative   Seasonal allergies    Tobacco use    Tubular adenoma 05/20/2017   Past Surgical History:  Procedure Laterality Date   ABDOMINAL HYSTERECTOMY  10/23/2008   ABDOMINOPLASTY     BARTHOLIN GLAND CYST EXCISION     BIOPSY  05/09/2017   Procedure: BIOPSY;  Surgeon: Danie Binder, MD;  [facility];  Service: Endoscopy;;  random colon;   CESAREAN SECTION  2000   CHOLECYSTECTOMY N/A 01/27/2014   Procedure: LAPAROSCOPIC CHOLECYSTECTOMY;  Surgeon: [name], MD;  Location: [facility];  Service: General;  Laterality: N/A;   COLONOSCOPY WITH PROPOFOL N/A 05/09/2017   Procedure: COLONOSCOPY WITH PROPOFOL;  Surgeon: Danie Binder, MD;  [facility];  Service: Endoscopy;  Laterality: N/A;   TONSILLECTOMY     Family History  Problem Relation Age of Onset   Osteoporosis Mother    Arthritis Mother  OA   Stroke Mother    Hypertension Mother    Depression Mother    Miscarriages / [place] Mother    Hypertension Father    Heart disease Father    Osteoporosis Father    Alzheimer's disease Father    Hepatitis Brother    Multiple sclerosis Brother    Stroke Brother    Stomach cancer Brother    Colon cancer Maternal Grandmother    Alzheimer's disease Paternal Grandmother    Inflammatory bowel disease Neg Hx    Crohn's disease Neg Hx    Allergies as of 09/12/2022   No Known Allergies      Medication List        Accurate as of September 12, 2022  9:54 AM. If you have any questions, ask your nurse or doctor.          atomoxetine 100 MG capsule Commonly known as: STRATTERA   clonazePAM 1 MG tablet Commonly known as: KLONOPIN Take 1 tablet by  mouth 2 (two) times daily as needed for anxiety.   cloNIDine HCl 0.1 MG Tb12 ER tablet Commonly known as: KAPVAY PLEASE SEE ATTACHED FOR DETAILED DIRECTIONS   estradiol 0.5 MG tablet Commonly known as: ESTRACE   gabapentin 100 MG capsule Commonly known as: NEURONTIN 1 TO 3 AT BEDTIME AS NEEDED INSOMNIA   hydrOXYzine 10 MG tablet Commonly known as: ATARAX TAKE 1 TABLET UP TO TWICE DAILY AS NEEDED FOR ANXIETY   INTUNIV PO one qd   progesterone 100 MG capsule Commonly known as: PROMETRIUM Take 200 mg by mouth daily.   traZODone 100 MG tablet Commonly known as: DESYREL TAKE 1 TABLET BY MOUTH EVERYDAY AT BEDTIME   Vraylar 3 MG capsule Generic drug: cariprazine Take 3 mg by mouth at bedtime.        All past medical history, surgical history, allergies, family history, immunizations andmedications were updated in the EMR today and reviewed under the history and medication portions of their EMR.     ROS Negative, with the exception of above mentioned in HPI   Objective:  BP 96/63   Pulse 91   Temp 98 F (36.7 C)   Wt 135 lb 9.6 oz (61.5 kg)   SpO2 99%   BMI 22.05 kg/m  Body mass index is 22.05 kg/m. Physical Exam Vitals and nursing note reviewed.  Constitutional:      General: She is not in acute distress.    Appearance: Normal appearance. She is normal weight. She is not ill-appearing or toxic-appearing.  HENT:     Head: Normocephalic and atraumatic.  Eyes:     General: No scleral icterus.       Right eye: No discharge.        Left eye: No discharge.     Extraocular Movements: Extraocular movements intact.     Conjunctiva/sclera: Conjunctivae normal.     Pupils: Pupils are equal, round, and reactive to light.  Neck:     Comments: No thyromegaly Cardiovascular:     Rate and Rhythm: Normal rate and regular rhythm.     Heart sounds: No murmur heard. Pulmonary:     Effort: No respiratory distress.     Breath sounds: Normal breath sounds. No wheezing,  rhonchi or rales.  Musculoskeletal:     Cervical back: Neck supple. No tenderness.  Lymphadenopathy:     Cervical: No cervical adenopathy.  Skin:    Findings: No rash.  Neurological:     Mental Status: She is alert and  oriented to person, place, and time. Mental status is at baseline.     Motor: No weakness.     Coordination: Coordination normal.     Gait: Gait normal.  Psychiatric:        Mood and Affect: Mood normal.        Behavior: Behavior normal.        Thought Content: Thought content normal.        Judgment: Judgment normal.      No results found. No results found. No results found for this or any previous visit (from the past 24 hour(s)).  Assessment/Plan: Samantha Lyons is a 52 y.o. female present for OV for  Sleep disorder/primary insomnia/bipolar disorder She is on multiple sedating medications, however trazodone, gabapentin and Klonopin are a lower dose.  She is also prescribed Lunesta.  She is treated by her therapist for her bipolar and sleep disturbance. They are requesting a further evaluation to rule out other sleep disorders. Discussed referral to the sleep specialist today.  And patient is agreeable to referral. - Ambulatory referral to Neurology    Reviewed expectations re: course of current medical issues. Discussed self-management of symptoms. Outlined signs and symptoms indicating need for more acute intervention. Patient verbalized understanding and all questions were answered. Patient received an After-Visit Summary.    Orders Placed This Encounter  Procedures   Ambulatory referral to Neurology   No orders of the defined types were placed in this encounter.  Referral Orders         Ambulatory referral to Neurology       Note is dictated utilizing voice recognition software. Although note has been proof read prior to signing, occasional typographical errors still can be missed. If any questions arise, please do not hesitate to call for  verification.   electronically signed by:  Howard Pouch, DO  [place]

## 2022-09-12 NOTE — Patient Instructions (Signed)
No follow-ups on file.        Great to see you today.  I have refilled the medication(s) we provide.   If labs were collected, we will inform you of lab results once received either by echart message or telephone call.   - echart message- for normal results that have been seen by the patient already.   - telephone call: abnormal results or if patient has not viewed results in their echart.  

## 2022-09-26 ENCOUNTER — Ambulatory Visit (AMBULATORY_SURGERY_CENTER)

## 2022-09-26 VITALS — Ht 65.75 in | Wt 135.0 lb

## 2022-09-26 DIAGNOSIS — Z860100 Personal history of colon polyps, unspecified: Secondary | ICD-10-CM

## 2022-09-26 MED ORDER — NA SULFATE-K SULFATE-MG SULF 17.5-3.13-1.6 GM/177ML PO SOLN: 1.0000 | Freq: Once | ORAL | 0 refills | Status: AC

## 2022-09-26 NOTE — Progress Notes (Signed)
No egg or soy allergy known to patient  No issues known to pt with past sedation with any surgeries or procedures - pt reports after her last colonoscopy she felt like a elephant was sitting on her chest in the recovery area  Patient denies ever being told they had issues or difficulty with intubation  No FH of Malignant Hyperthermia Pt is not on diet pills Pt is not on  home 02  Pt is not on blood thinners  Pt with chronic constipation takes otc laxative with daily BM No A fib or A flutter Have any cardiac testing pending--no Pt instructed to use Singlecare.com or GoodRx for a price reduction on prep   Patient's chart reviewed by Osvaldo Angst CNRA prior to previsit and patient appropriate for the Impact.  Previsit completed and red dot placed by patient's name on their procedure day (on provider's schedule).

## 2022-10-17 ENCOUNTER — Encounter: Payer: Self-pay | Admitting: Gastroenterology

## 2022-10-17 ENCOUNTER — Ambulatory Visit (AMBULATORY_SURGERY_CENTER): Admitting: Gastroenterology

## 2022-10-17 VITALS — BP 117/68 | HR 63 | Temp 98.0°F | Resp 9 | Ht 65.75 in | Wt 135.0 lb

## 2022-10-17 DIAGNOSIS — Z09 Encounter for follow-up examination after completed treatment for conditions other than malignant neoplasm: Secondary | ICD-10-CM

## 2022-10-17 DIAGNOSIS — Z860100 Personal history of colon polyps, unspecified: Secondary | ICD-10-CM

## 2022-10-17 MED ORDER — SODIUM CHLORIDE 0.9 % IV SOLN: 500.0000 mL | Freq: Once | INTRAVENOUS | Status: DC

## 2022-10-17 MED ORDER — PLENVU 140 G PO SOLR: 1.0000 | ORAL | 0 refills | Status: AC

## 2022-10-17 NOTE — Op Note (Signed)
[facility] Patient Name: Samantha Lyons Procedure Date: 10/17/2022 12:54 PM MRN: [id number] Endoscopist: Napoleon Form , MD, [id number] Age: 52 Referring MD:  Date of Birth: June 01, 1971 Gender: Female Account #: [id number] Procedure:                Colonoscopy Indications:              High risk colon cancer surveillance: Personal                            history of colonic polyps Medicines:                Monitored Anesthesia Care Procedure:                Pre-Anesthesia Assessment:                           - Prior to the procedure, a History and Physical                            was performed, and patient medications and                            allergies were reviewed. The patient's tolerance of                            previous anesthesia was also reviewed. The risks                            and benefits of the procedure and the sedation                            options and risks were discussed with the patient.                            All questions were answered, and informed consent                            was obtained. Prior Anticoagulants: The patient has                            taken no anticoagulant or antiplatelet agents. ASA                            Grade Assessment: II - A patient with mild systemic                            disease. After reviewing the risks and benefits,                            the patient was deemed in satisfactory condition to                            undergo the procedure.  After obtaining informed consent, the colonoscope                            was passed under direct vision. Throughout the                            procedure, the patient's blood pressure, pulse, and                            oxygen saturations were monitored continuously. The                            [id number]-HQ190L Colonoscope [id number] was introduced                            through the anus with the intention  of advancing to                            the cecum. The scope was advanced to the transverse                            colon before the procedure was aborted. Medications                            were given. The colonoscopy was somewhat difficult                            due to inadequate bowel prep. The patient tolerated                            the procedure well. The quality of the bowel                            preparation was poor. Scope In: 3:25:05 PM Scope Out: 3:29:46 PM Total Procedure Duration: 0 hours 4 minutes 41 seconds  Findings:                 The perianal and digital rectal examinations were                            normal.                           A moderate amount of semi-liquid semi-solid stool                            was found in the entire colon, precluding                            visualization. Lavage of the area was performed,                            resulting in incomplete clearance with continued  poor visualization. Complications:            No immediate complications. Estimated Blood Loss:     Estimated blood loss: none. Impression:               - Preparation of the colon was poor.                           - Stool in the entire examined colon.                           - No specimens collected. Recommendation:           - Resume previous diet.                           - Continue present medications.                           - Repeat colonoscopy at the next available                            appointment because the bowel preparation was                            suboptimal. Napoleon Form, MD 10/17/2022 3:36:35 PM This report has been signed electronically.

## 2022-10-17 NOTE — Patient Instructions (Signed)
Thank you for letting us take care of your healthcare needs today. Please see new prep instructions for repeat colonscopy on Talita Recht April 22 at 1:30.    YOU HAD AN ENDOSCOPIC PROCEDURE TODAY AT THE [facility]:   Refer to the procedure report that was given to you for any specific questions about what was found during the examination.  If the procedure report does not answer your questions, please call your gastroenterologist to clarify.  If you requested that your care partner not be given the details of your procedure findings, then the procedure report has been included in a sealed envelope for you to review at your convenience later.  YOU SHOULD EXPECT: Some feelings of bloating in the abdomen. Passage of more gas than usual.  Walking can help get rid of the air that was put into your GI tract during the procedure and reduce the bloating. If you had a lower endoscopy (such as a colonoscopy or flexible sigmoidoscopy) you may notice spotting of blood in your stool or on the toilet paper. If you underwent a bowel prep for your procedure, you may not have a normal bowel movement for a few days.  Please Note:  You might notice some irritation and congestion in your nose or some drainage.  This is from the oxygen used during your procedure.  There is no need for concern and it should clear up in a day or so.  SYMPTOMS TO REPORT IMMEDIATELY:  Following lower endoscopy (colonoscopy or flexible sigmoidoscopy):  Excessive amounts of blood in the stool  Significant tenderness or worsening of abdominal pains  Swelling of the abdomen that is new, acute  Fever of 100F or higher   For urgent or emergent issues, a gastroenterologist can be reached at any hour by calling [phone]. Do not use MyChart messaging for urgent concerns.    DIET:  We do recommend a small meal at first, but then you may proceed to your regular diet.  Drink plenty of fluids but you should avoid alcoholic  beverages for 24 hours.  ACTIVITY:  You should plan to take it easy for the rest of today and you should NOT DRIVE or use heavy machinery until tomorrow (because of the sedation medicines used during the test).    FOLLOW UP: Our staff will call the number listed on your records the next business day following your procedure.  We will call around 7:15- 8:00 am to check on you and address any questions or concerns that you may have regarding the information given to you following your procedure. If we do not reach you, we will leave a message.     If any biopsies were taken you will be contacted by phone or by letter within the next 1-3 weeks.  Please call us at [phone] if you have not heard about the biopsies in 3 weeks.    SIGNATURES/CONFIDENTIALITY: You and/or your care partner have signed paperwork which will be entered into your electronic medical record.  These signatures attest to the fact that that the information above on your After Visit Summary has been reviewed and is understood.  Full responsibility of the confidentiality of this discharge information lies with you and/or your care-partner.

## 2022-10-17 NOTE — Progress Notes (Unsigned)
Pt's states no medical or surgical changes since previsit or office visit. 

## 2022-10-17 NOTE — Progress Notes (Unsigned)
Aborted procedure. Uneventful anesthetic. Report to pacu rn. Vss. Care resumed by rn.

## 2022-10-17 NOTE — Progress Notes (Unsigned)
Gastroenterology History and Physical   Primary Care Physician:  Natalia Leatherwood, DO   Reason for Procedure:  History of adenomatous colon polyps  Plan:    Surveillance colonoscopy with possible interventions as needed     HPI: Samantha Lyons is a very pleasant 52 y.o. female here for surveillance colonoscopy. Denies any nausea, vomiting, abdominal pain, melena or bright red blood per rectum  The risks and benefits as well as alternatives of endoscopic procedure(s) have been discussed and reviewed. All questions answered. The patient agrees to proceed.    Past Medical History:  Diagnosis Date   Bipolar disorder, unspecified    Bright red blood per rectum    Cholelithiasis    Chronic fatigue syndrome    Colitis    Depression    Eating disorder    Endometriosis    Fecal incontinence 04/24/2015   Female hypogonadism    Generalized anxiety disorder    GERD (gastroesophageal reflux disease)    History of chicken pox    Low testosterone level in female    05/08/2014   Marijuana use 10/06/2015   - Positive THC in ED - THC negative 10/07/2015, UDS negative   Seasonal allergies    Tobacco use    Tubular adenoma 05/20/2017    Past Surgical History:  Procedure Laterality Date   ABDOMINAL HYSTERECTOMY  10/23/2008   ABDOMINOPLASTY     BARTHOLIN GLAND CYST EXCISION     BIOPSY  05/09/2017   Procedure: BIOPSY;  Surgeon: West Bali, MD;  Location: [facility];  Service: Endoscopy;;  random colon;   CESAREAN SECTION  2000   CHOLECYSTECTOMY N/A 01/27/2014   Procedure: LAPAROSCOPIC CHOLECYSTECTOMY;  Surgeon: Dalia Heading, MD;  Location: [facility];  Service: General;  Laterality: N/A;   COLONOSCOPY WITH PROPOFOL N/A 05/09/2017   Procedure: COLONOSCOPY WITH PROPOFOL;  Surgeon: West Bali, MD;  Location: [facility];  Service: Endoscopy;  Laterality: N/A;   TONSILLECTOMY      Prior to Admission medications   Medication Sig Start Date End Date Taking? Authorizing  Provider  atomoxetine (STRATTERA) 100 MG capsule  04/29/20   [provider]  clonazePAM (KLONOPIN) 1 MG tablet Take 1 tablet by mouth 2 (two) times daily as needed for anxiety. 03/29/17   [provider]  cloNIDine HCl (KAPVAY) 0.1 MG TB12 ER tablet PLEASE SEE ATTACHED FOR DETAILED DIRECTIONS    [provider]  estradiol (ESTRACE) 0.5 MG tablet  07/09/20   [provider]  eszopiclone (LUNESTA) 1 MG TABS tablet Take 1 mg by mouth at bedtime. 09/21/22   [provider]  gabapentin (NEURONTIN) 100 MG capsule 1 TO 3 AT BEDTIME AS NEEDED INSOMNIA    [provider]  guanFACINE HCl (INTUNIV PO) one qd    [provider]  hydrOXYzine (VISTARIL) 25 MG capsule Take 25 mg by mouth at bedtime. 06/20/22   [provider]  lamoTRIgine (LAMICTAL) 200 MG tablet Take 200 mg by mouth daily. 07/20/22   [provider]  prazosin (MINIPRESS) 2 MG capsule Take 2 mg by mouth at bedtime. 09/20/22   [provider]  progesterone (PROMETRIUM) 100 MG capsule Take 200 mg by mouth daily. 07/21/21   [provider]  traZODone (DESYREL) 100 MG tablet TAKE 1 TABLET BY MOUTH EVERYDAY AT BEDTIME    [provider]  VRAYLAR 3 MG capsule Take 3 mg by mouth at bedtime. Patient not taking: Reported on 09/26/2022  [provider]    Current Outpatient Medications  Medication Sig Dispense Refill   atomoxetine (STRATTERA) 100 MG capsule      clonazePAM (KLONOPIN) 1 MG tablet Take 1 tablet by mouth 2 (two) times daily as needed for anxiety.     cloNIDine HCl (KAPVAY) 0.1 MG TB12 ER tablet PLEASE SEE ATTACHED FOR DETAILED DIRECTIONS     estradiol (ESTRACE) 0.5 MG tablet      eszopiclone (LUNESTA) 1 MG TABS tablet Take 1 mg by mouth at bedtime.     gabapentin (NEURONTIN) 100 MG capsule 1 TO 3 AT BEDTIME AS NEEDED INSOMNIA     guanFACINE HCl (INTUNIV PO) one qd     hydrOXYzine (VISTARIL) 25 MG capsule Take 25 mg by mouth  at bedtime.     lamoTRIgine (LAMICTAL) 200 MG tablet Take 200 mg by mouth daily.     prazosin (MINIPRESS) 2 MG capsule Take 2 mg by mouth at bedtime.     progesterone (PROMETRIUM) 100 MG capsule Take 200 mg by mouth daily.     traZODone (DESYREL) 100 MG tablet TAKE 1 TABLET BY MOUTH EVERYDAY AT BEDTIME     VRAYLAR 3 MG capsule Take 3 mg by mouth at bedtime. (Patient not taking: Reported on 09/26/2022)     Current Facility-Administered Medications  Medication Dose Route Frequency Provider Last Rate Last Admin   0.9 %  sodium chloride infusion  500 mL Intravenous Once Napoleon Form, MD        Allergies as of 10/17/2022   (No Known Allergies)    Family History  Problem Relation Age of Onset   Colon polyps Mother    Osteoporosis Mother    Arthritis Mother        OA   Stroke Mother    Hypertension Mother    Depression Mother    Miscarriages / [place] Mother    Colon polyps Father    Hypertension Father    Heart disease Father    Osteoporosis Father    Alzheimer's disease Father    Hepatitis Brother    Multiple sclerosis Brother    Stroke Brother    Stomach cancer Brother    Colon cancer Maternal Grandmother    Alzheimer's disease Paternal Grandmother    Inflammatory bowel disease Neg Hx    Crohn's disease Neg Hx    Esophageal cancer Neg Hx    Rectal cancer Neg Hx     Social History   Socioeconomic History   Marital status: Married    Spouse name: Not on file   Number of children: Not on file   Years of education: Not on file   Highest education level: Not on file  Occupational History   Not on file  Tobacco Use   Smoking status: Former    Packs/day: 0.25    Years: 20.00    Additional pack years: 0.00    Total pack years: 5.00    Types: Cigarettes    Quit date: 2023    Years since quitting: 1.2   Smokeless tobacco: Never  Vaping Use   Vaping Use: Every day  Substance and Sexual Activity   Alcohol use: Not Currently    Comment: beer occ   Drug  use: Yes    Frequency: 7.0 times per week    Types: Marijuana    Comment: started with abdominal pain   Sexual activity: Yes    Partners: Male    Birth control/protection: Surgical, None  Other Topics Concern  Not on file  Social History Narrative   Married. High school grad.   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    Smoker. Herbal remedies, seat belts.   Smoke alarm in the home.    Guns in the home (in a locked cabinet)   Feels safe in relationships   Social Determinants of Health   Financial Resource Strain: Not on file  Food Insecurity: Not on file  Transportation Needs: Not on file  Physical Activity: Not on file  Stress: Not on file  Social Connections: Not on file  Intimate Partner Violence: Not on file    Review of Systems:  All other review of systems negative except as mentioned in the HPI.  Physical Exam: Vital signs in last 24 hours: Blood Pressure 106/71   Pulse 73   Temperature 98 F (36.7 C)   Height 5' 5.75" (1.67 m)   Weight 135 lb (61.2 kg)   Oxygen Saturation 97%   Body Mass Index 21.96 kg/m  General:   Alert, NAD Lungs:  Clear .   Heart:  Regular rate and rhythm Abdomen:  Soft, nontender and nondistended. Neuro/Psych:  Alert and cooperative. Normal mood and affect. A and O x 3  Reviewed labs, radiology imaging, old records and pertinent past GI work up  Patient is appropriate for planned procedure(s) and anesthesia in an ambulatory setting   K. Scherry Ran , MD [phone]

## 2022-10-18 ENCOUNTER — Encounter: Payer: Self-pay | Admitting: Gastroenterology

## 2022-10-18 ENCOUNTER — Telehealth: Payer: Self-pay | Admitting: *Deleted

## 2022-10-18 NOTE — Telephone Encounter (Signed)
  Follow up Call-     10/17/2022    1:12 PM  Call back number  Post procedure Call Back phone  # [phone]  Permission to leave phone message Yes     Patient questions:   Message left to call us if necessary.

## 2022-10-24 ENCOUNTER — Encounter: Payer: Self-pay | Admitting: Gastroenterology

## 2022-10-24 ENCOUNTER — Ambulatory Visit (AMBULATORY_SURGERY_CENTER): Admitting: Gastroenterology

## 2022-10-24 VITALS — BP 101/68 | HR 70 | Temp 97.5°F | Resp 13 | Ht 65.0 in | Wt 135.0 lb

## 2022-10-24 DIAGNOSIS — D128 Benign neoplasm of rectum: Secondary | ICD-10-CM | POA: Diagnosis not present

## 2022-10-24 DIAGNOSIS — Z09 Encounter for follow-up examination after completed treatment for conditions other than malignant neoplasm: Secondary | ICD-10-CM

## 2022-10-24 DIAGNOSIS — D124 Benign neoplasm of descending colon: Secondary | ICD-10-CM

## 2022-10-24 DIAGNOSIS — D123 Benign neoplasm of transverse colon: Secondary | ICD-10-CM

## 2022-10-24 DIAGNOSIS — D120 Benign neoplasm of cecum: Secondary | ICD-10-CM | POA: Diagnosis not present

## 2022-10-24 DIAGNOSIS — Z86010 Personal history of colonic polyps: Secondary | ICD-10-CM | POA: Diagnosis not present

## 2022-10-24 MED ORDER — SODIUM CHLORIDE 0.9 % IV SOLN
500.0000 mL | Freq: Once | INTRAVENOUS | Status: DC
Start: 2022-10-24 — End: 2022-10-24

## 2022-10-24 NOTE — Patient Instructions (Signed)
Please read handouts provided. Continue present medications. Await pathology results.   YOU HAD AN ENDOSCOPIC PROCEDURE TODAY AT THE  ENDOSCOPY CENTER:   Refer to the procedure report that was given to you for any specific questions about what was found during the examination.  If the procedure report does not answer your questions, please call your gastroenterologist to clarify.  If you requested that your care partner not be given the details of your procedure findings, then the procedure report has been included in a sealed envelope for you to review at your convenience later.  YOU SHOULD EXPECT: Some feelings of bloating in the abdomen. Passage of more gas than usual.  Walking can help get rid of the air that was put into your GI tract during the procedure and reduce the bloating. If you had a lower endoscopy (such as a colonoscopy or flexible sigmoidoscopy) you may notice spotting of blood in your stool or on the toilet paper. If you underwent a bowel prep for your procedure, you may not have a normal bowel movement for a few days.  Please Note:  You might notice some irritation and congestion in your nose or some drainage.  This is from the oxygen used during your procedure.  There is no need for concern and it should clear up in a day or so.  SYMPTOMS TO REPORT IMMEDIATELY:  Following lower endoscopy (colonoscopy or flexible sigmoidoscopy):  Excessive amounts of blood in the stool  Significant tenderness or worsening of abdominal pains  Swelling of the abdomen that is new, acute  Fever of 100F or higher  For urgent or emergent issues, a gastroenterologist can be reached at any hour by calling [phone]. Do not use MyChart messaging for urgent concerns.    DIET:  We do recommend a small meal at first, but then you may proceed to your regular diet.  Drink plenty of fluids but you should avoid alcoholic beverages for 24 hours.  ACTIVITY:  You should plan to take it easy for  the rest of today and you should NOT DRIVE or use heavy machinery until tomorrow (because of the sedation medicines used during the test).    FOLLOW UP: Our staff will call the number listed on your records the next business day following your procedure.  We will call around 7:15- 8:00 am to check on you and address any questions or concerns that you may have regarding the information given to you following your procedure. If we do not reach you, we will leave a message.     If any biopsies were taken you will be contacted by phone or by letter within the next 1-3 weeks.  Please call us at [phone] if you have not heard about the biopsies in 3 weeks.    SIGNATURES/CONFIDENTIALITY: You and/or your care partner have signed paperwork which will be entered into your electronic medical record.  These signatures attest to the fact that that the information above on your After Visit Summary has been reviewed and is understood.  Full responsibility of the confidentiality of this discharge information lies with you and/or your care-partner. 

## 2022-10-24 NOTE — Progress Notes (Signed)
Report given to PACU, vss 

## 2022-10-24 NOTE — Progress Notes (Signed)
[facility] History and Physical   Primary Care Physician:  Natalia Leatherwood, DO   Reason for Procedure:  History of adenomatous colon polyps  Plan:    Surveillance colonoscopy with possible interventions as needed     HPI: Samantha Lyons is a very pleasant 52 y.o. female here for surveillance colonoscopy. Denies any nausea, vomiting, abdominal pain, melena or bright red blood per rectum  The risks and benefits as well as alternatives of endoscopic procedure(s) have been discussed and reviewed. All questions answered. The patient agrees to proceed.    Past Medical History:  Diagnosis Date   Bipolar disorder, unspecified    Bright red blood per rectum    Cholelithiasis    Chronic fatigue syndrome    Colitis    Depression    Eating disorder    Endometriosis    Fecal incontinence 04/24/2015   Female hypogonadism    Generalized anxiety disorder    GERD (gastroesophageal reflux disease)    History of chicken pox    Low testosterone level in female    05/08/2014   Marijuana use 10/06/2015   - Positive THC in ED - THC negative 10/07/2015, UDS negative   Seasonal allergies    Tobacco use    Tubular adenoma 05/20/2017    Past Surgical History:  Procedure Laterality Date   ABDOMINAL HYSTERECTOMY  10/23/2008   ABDOMINOPLASTY     BARTHOLIN GLAND CYST EXCISION     BIOPSY  05/09/2017   Procedure: BIOPSY;  Surgeon: West Bali, MD;  Location: [facility];  Service: Endoscopy;;  random colon;   CESAREAN SECTION  2000   CHOLECYSTECTOMY N/A 01/27/2014   Procedure: LAPAROSCOPIC CHOLECYSTECTOMY;  Surgeon: Dalia Heading, MD;  Location: [facility];  Service: General;  Laterality: N/A;   COLONOSCOPY WITH PROPOFOL N/A 05/09/2017   Procedure: COLONOSCOPY WITH PROPOFOL;  Surgeon: West Bali, MD;  Location: [facility];  Service: Endoscopy;  Laterality: N/A;   TONSILLECTOMY      Prior to Admission medications   Medication Sig Start Date End Date Taking? Authorizing  Provider  atomoxetine (STRATTERA) 100 MG capsule  04/29/20  Yes [provider]  cloNIDine HCl (KAPVAY) 0.1 MG TB12 ER tablet PLEASE SEE ATTACHED FOR DETAILED DIRECTIONS   Yes [provider]  estradiol (ESTRACE) 0.5 MG tablet  07/09/20  Yes [provider]  eszopiclone (LUNESTA) 1 MG TABS tablet Take 1 mg by mouth at bedtime. 09/21/22  Yes [provider]  gabapentin (NEURONTIN) 100 MG capsule 1 TO 3 AT BEDTIME AS NEEDED INSOMNIA   Yes [provider]  guanFACINE HCl (INTUNIV PO) one qd   Yes [provider]  hydrOXYzine (VISTARIL) 25 MG capsule Take 25 mg by mouth at bedtime. 06/20/22  Yes [provider]  lamoTRIgine (LAMICTAL) 200 MG tablet Take 200 mg by mouth daily. 07/20/22  Yes [provider]  prazosin (MINIPRESS) 2 MG capsule Take 2 mg by mouth at bedtime. 09/20/22  Yes [provider]  progesterone (PROMETRIUM) 100 MG capsule Take 200 mg by mouth daily. 07/21/21  Yes [provider]  traZODone (DESYREL) 100 MG tablet TAKE 1 TABLET BY MOUTH EVERYDAY AT BEDTIME   Yes [provider]  VRAYLAR 3 MG capsule Take 3 mg by mouth at bedtime.   Yes [provider]  clonazePAM (KLONOPIN) 1 MG tablet Take 1 tablet by mouth 2 (two) times daily as needed for anxiety. 03/29/17   [provider]  Current Outpatient Medications  Medication Sig Dispense Refill   atomoxetine (STRATTERA) 100 MG capsule      cloNIDine HCl (KAPVAY) 0.1 MG TB12 ER tablet PLEASE SEE ATTACHED FOR DETAILED DIRECTIONS     estradiol (ESTRACE) 0.5 MG tablet      eszopiclone (LUNESTA) 1 MG TABS tablet Take 1 mg by mouth at bedtime.     gabapentin (NEURONTIN) 100 MG capsule 1 TO 3 AT BEDTIME AS NEEDED INSOMNIA     guanFACINE HCl (INTUNIV PO) one qd     hydrOXYzine (VISTARIL) 25 MG capsule Take 25 mg by mouth at bedtime.     lamoTRIgine (LAMICTAL) 200 MG tablet Take 200 mg by mouth daily.     prazosin (MINIPRESS) 2 MG  capsule Take 2 mg by mouth at bedtime.     progesterone (PROMETRIUM) 100 MG capsule Take 200 mg by mouth daily.     traZODone (DESYREL) 100 MG tablet TAKE 1 TABLET BY MOUTH EVERYDAY AT BEDTIME     VRAYLAR 3 MG capsule Take 3 mg by mouth at bedtime.     clonazePAM (KLONOPIN) 1 MG tablet Take 1 tablet by mouth 2 (two) times daily as needed for anxiety.     Current Facility-Administered Medications  Medication Dose Route Frequency Provider Last Rate Last Admin   0.9 %  sodium chloride infusion  500 mL Intravenous Once Ariatna Jester, Eleonore Chiquito, MD        Allergies as of 10/24/2022   (No Known Allergies)    Family History  Problem Relation Age of Onset   Colon polyps Mother    Osteoporosis Mother    Arthritis Mother        OA   Stroke Mother    Hypertension Mother    Depression Mother    Miscarriages / [place] Mother    Colon polyps Father    Hypertension Father    Heart disease Father    Osteoporosis Father    Alzheimer's disease Father    Hepatitis Brother    Multiple sclerosis Brother    Stroke Brother    Stomach cancer Brother    Colon cancer Maternal Grandmother    Alzheimer's disease Paternal Grandmother    Inflammatory bowel disease Neg Hx    Crohn's disease Neg Hx    Esophageal cancer Neg Hx    Rectal cancer Neg Hx     Social History   Socioeconomic History   Marital status: Married    Spouse name: Not on file   Number of children: Not on file   Years of education: Not on file   Highest education level: Not on file  Occupational History   Not on file  Tobacco Use   Smoking status: Former    Packs/day: 0.25    Years: 20.00    Additional pack years: 0.00    Total pack years: 5.00    Types: Cigarettes    Quit date: 2023    Years since quitting: 1.3   Smokeless tobacco: Never  Vaping Use   Vaping Use: Every day  Substance and Sexual Activity   Alcohol use: Not Currently    Comment: beer occ   Drug use: Yes    Frequency: 7.0 times per week    Types:  Marijuana    Comment: started with abdominal pain   Sexual activity: Yes    Partners: Male    Birth control/protection: Surgical, None  Other Topics Concern   Not on file  Social History Narrative   Married. High  school grad.   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    Smoker. Herbal remedies, seat belts.   Smoke alarm in the home.    Guns in the home (in a locked cabinet)   Feels safe in relationships   Social Determinants of Health   Financial Resource Strain: Not on file  Food Insecurity: Not on file  Transportation Needs: Not on file  Physical Activity: Not on file  Stress: Not on file  Social Connections: Not on file  Intimate Partner Violence: Not on file    Review of Systems:  All other review of systems negative except as mentioned in the HPI.  Physical Exam: Vital signs in last 24 hours: Blood Pressure 122/77   Pulse 81   Temperature (Abnormal) 97.5 F (36.4 C) (Skin)   Respiration 14   Height 5\' 5"  (1.651 m)   Weight 135 lb (61.2 kg)   Oxygen Saturation 99%   Body Mass Index 22.47 kg/m  General:   Alert, NAD Lungs:  Clear .   Heart:  Regular rate and rhythm Abdomen:  Soft, nontender and nondistended. Neuro/Psych:  Alert and cooperative. Normal mood and affect. A and O x 3  Reviewed labs, radiology imaging, old records and pertinent past GI work up  Patient is appropriate for planned procedure(s) and anesthesia in an ambulatory setting   K. Scherry Ran , MD [phone]

## 2022-10-24 NOTE — Progress Notes (Signed)
VS by DT  Pt's states no medical or surgical changes since previsit or office visit.  

## 2022-10-24 NOTE — Op Note (Addendum)
[facility] Patient Name: Samantha Lyons Procedure Date: 10/24/2022 1:33 PM MRN: [id number] Endoscopist: Napoleon Form , MD, [id number] Age: 52 Referring MD:  Date of Birth: 11-12-1970 Gender: Female Account #: [id number] Procedure:                Colonoscopy Indications:              High risk colon cancer surveillance: Personal                            history of colonic polyps, High risk colon cancer                            surveillance: Personal history of adenoma less than                            10 mm in size Medicines:                Monitored Anesthesia Care Procedure:                Pre-Anesthesia Assessment:                           - Prior to the procedure, a History and Physical                            was performed, and patient medications and                            allergies were reviewed. The patient's tolerance of                            previous anesthesia was also reviewed. The risks                            and benefits of the procedure and the sedation                            options and risks were discussed with the patient.                            All questions were answered, and informed consent                            was obtained. Prior Anticoagulants: The patient has                            taken no anticoagulant or antiplatelet agents. ASA                            Grade Assessment: II - A patient with mild systemic                            disease. After reviewing the risks and benefits,  the patient was deemed in satisfactory condition to                            undergo the procedure.                           After obtaining informed consent, the colonoscope                            was passed under direct vision. Throughout the                            procedure, the patient's blood pressure, pulse, and                            oxygen saturations were monitored  continuously. The                            [id number]-HQ190L Colonoscope [id number] was introduced                            through the anus and advanced to the the cecum,                            identified by appendiceal orifice and ileocecal                            valve. The colonoscopy was performed without                            difficulty. The patient tolerated the procedure                            well. The quality of the bowel preparation was                            good. The ileocecal valve, appendiceal orifice, and                            rectum were photographed. Scope In: 1:37:11 PM Scope Out: 1:54:58 PM Scope Withdrawal Time: 0 hours 14 minutes 24 seconds  Total Procedure Duration: 0 hours 17 minutes 47 seconds  Findings:                 The perianal and digital rectal examinations were                            normal.                           Four sessile polyps were found in the rectum,                            descending colon, transverse colon and cecum. The  polyps were 4 to 10 mm in size. These polyps were                            removed with a cold snare. Resection and retrieval                            were complete.                           Non-bleeding external and internal hemorrhoids were                            found during retroflexion. The hemorrhoids were                            small. Complications:            No immediate complications. Estimated Blood Loss:     Estimated blood loss was minimal. Impression:               - Four 4 to 10 mm polyps in the rectum, in the                            descending colon, in the transverse colon and in                            the cecum, removed with a cold snare. Resected and                            retrieved.                           - Non-bleeding external and internal hemorrhoids.                           - The GI Genius (intelligent endoscopy  module),                            computer-aided polyp detection system powered by AI                            was utilized to detect colorectal polyps through                            enhanced visualization during colonoscopy. Recommendation:           - Patient has a contact number available for                            emergencies. The signs and symptoms of potential                            delayed complications were discussed with the                            patient.  Return to normal activities tomorrow.                            Written discharge instructions were provided to the                            patient.                           - Resume previous diet.                           - Continue present medications.                           - Await pathology results.                           - Repeat colonoscopy in 3 - 5 years for                            surveillance. Napoleon Form, MD 10/24/2022 2:02:18 PM This report has been signed electronically.

## 2022-10-24 NOTE — Progress Notes (Signed)
Called to room to assist during endoscopic procedure.  Patient ID and intended procedure confirmed with present staff. Received instructions for my participation in the procedure from the performing physician.  

## 2022-10-25 ENCOUNTER — Telehealth: Payer: Self-pay | Admitting: *Deleted

## 2022-10-25 NOTE — Telephone Encounter (Signed)
No answer on  follow up call. Left message.   

## 2022-11-03 ENCOUNTER — Encounter: Payer: Self-pay | Admitting: Gastroenterology

## 2022-11-16 ENCOUNTER — Ambulatory Visit: Admitting: Family Medicine

## 2022-11-16 ENCOUNTER — Telehealth: Payer: Self-pay | Admitting: Family Medicine

## 2022-11-16 ENCOUNTER — Other Ambulatory Visit: Payer: Self-pay

## 2022-11-16 ENCOUNTER — Encounter: Payer: Self-pay | Admitting: Family Medicine

## 2022-11-16 VITALS — BP 114/79 | HR 98 | Temp 97.7°F | Wt 133.6 lb

## 2022-11-16 DIAGNOSIS — G479 Sleep disorder, unspecified: Secondary | ICD-10-CM

## 2022-11-16 DIAGNOSIS — F5101 Primary insomnia: Secondary | ICD-10-CM

## 2022-11-16 DIAGNOSIS — S46011A Strain of muscle(s) and tendon(s) of the rotator cuff of right shoulder, initial encounter: Secondary | ICD-10-CM | POA: Diagnosis not present

## 2022-11-16 MED ORDER — METHOCARBAMOL 500 MG PO TABS: 500.0000 mg | ORAL_TABLET | Freq: Three times a day (TID) | ORAL | 0 refills | Status: DC | PRN

## 2022-11-16 MED ORDER — NAPROXEN 500 MG PO TABS: 500.0000 mg | ORAL_TABLET | Freq: Two times a day (BID) | ORAL | 0 refills | Status: DC

## 2022-11-16 NOTE — Progress Notes (Signed)
Samantha Lyons , [birth year], 52 y.o., female MRN: [id number] Patient Care Team    Relationship Specialty Notifications Start End  Natalia Leatherwood, DO PCP - General Family Medicine  07/27/21   [facility] Consulting Physician Gastroenterology  07/05/15   Marcelle Overlie, MD Consulting Physician Obstetrics and Gynecology  07/27/21     Chief Complaint  Patient presents with   Shoulder Pain    Right; 1 week;     Subjective: Samantha Lyons is a 52 y.o. Pt presents for an OV with complaints of right shoulder pain  of 1 week duration.  Associated symptoms include pain with right arm movement and laying on her right arm.. She denies injury prior to onset of symptoms.  She denies prior injury or surgical procedure on her right shoulder. Pt has tried nothing to ease their symptoms.      07/29/2022    2:37 PM  Depression screen PHQ 2/9  Decreased Interest 3  Down, Depressed, Hopeless 2  PHQ - 2 Score 5  Altered sleeping 3  Tired, decreased energy 0  Change in appetite 0  Feeling bad or failure about yourself  1  Trouble concentrating 1  Moving slowly or fidgety/restless 3  Suicidal thoughts 0  PHQ-9 Score 13    No Known Allergies Social History   Social History Narrative   Married. High school grad.   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    Smoker. Herbal remedies, seat belts.   Smoke alarm in the home.    Guns in the home (in a locked cabinet)   Feels safe in relationships   Past Medical History:  Diagnosis Date   Bipolar disorder, unspecified (HCC)    Bright red blood per rectum    Cholelithiasis    Chronic fatigue syndrome    Colitis    Depression    Eating disorder    Endometriosis    Fecal incontinence 04/24/2015   Female hypogonadism    Generalized anxiety disorder    GERD (gastroesophageal reflux disease)    History of chicken pox    Low testosterone level in female    05/08/2014   Marijuana use 10/06/2015   -  Positive THC in ED - THC negative 10/07/2015, UDS negative   Seasonal allergies    Tobacco use    Tubular adenoma 05/20/2017   Past Surgical History:  Procedure Laterality Date   ABDOMINAL HYSTERECTOMY  10/23/2008   ABDOMINOPLASTY     BARTHOLIN GLAND CYST EXCISION     BIOPSY  05/09/2017   Procedure: BIOPSY;  Surgeon: West Bali, MD;  [facility];  Service: Endoscopy;;  random colon;   CESAREAN SECTION  2000   CHOLECYSTECTOMY N/A 01/27/2014   Procedure: LAPAROSCOPIC CHOLECYSTECTOMY;  Surgeon: Dalia Heading, MD;  Location: [facility];  Service: General;  Laterality: N/A;   COLONOSCOPY WITH PROPOFOL N/A 05/09/2017   Procedure: COLONOSCOPY WITH PROPOFOL;  Surgeon: West Bali, MD;  [facility]: [facility];  Service: Endoscopy;  Laterality: N/A;   TONSILLECTOMY     Family History  Problem Relation Age of Onset   Colon polyps Mother    Osteoporosis Mother    Arthritis Mother        OA   Stroke Mother    Hypertension Mother    Depression Mother    Miscarriages / [place] Mother    Colon polyps Father    Hypertension Father    Heart  disease Father    Osteoporosis Father    Alzheimer's disease Father    Hepatitis Brother    Multiple sclerosis Brother    Stroke Brother    Stomach cancer Brother    Colon cancer Maternal Grandmother    Alzheimer's disease Paternal Grandmother    Inflammatory bowel disease Neg Hx    Crohn's disease Neg Hx    Esophageal cancer Neg Hx    Rectal cancer Neg Hx    Allergies as of 11/16/2022   No Known Allergies      Medication List        Accurate as of Nov 16, 2022 11:47 AM. If you have any questions, ask your nurse or doctor.          atomoxetine 100 MG capsule Commonly known as: STRATTERA   clonazePAM 1 MG tablet Commonly known as: KLONOPIN Take 1 tablet by mouth 2 (two) times daily as needed for anxiety.   cloNIDine HCl 0.1 MG Tb12 ER tablet Commonly known as: KAPVAY PLEASE SEE ATTACHED FOR DETAILED  DIRECTIONS   estradiol 0.5 MG tablet Commonly known as: ESTRACE   eszopiclone 1 MG Tabs tablet Commonly known as: LUNESTA Take 1 mg by mouth at bedtime.   gabapentin 100 MG capsule Commonly known as: NEURONTIN 1 TO 3 AT BEDTIME AS NEEDED INSOMNIA   hydrOXYzine 25 MG capsule Commonly known as: VISTARIL Take 25 mg by mouth at bedtime.   INTUNIV PO one qd   lamoTRIgine 200 MG tablet Commonly known as: LAMICTAL Take 200 mg by mouth daily.   prazosin 2 MG capsule Commonly known as: MINIPRESS Take 2 mg by mouth at bedtime.   progesterone 100 MG capsule Commonly known as: PROMETRIUM Take 200 mg by mouth daily.   traZODone 100 MG tablet Commonly known as: DESYREL TAKE 1 TABLET BY MOUTH EVERYDAY AT BEDTIME   Vraylar 3 MG capsule Generic drug: cariprazine Take 3 mg by mouth at bedtime.        All past medical history, surgical history, allergies, family history, immunizations andmedications were updated in the EMR today and reviewed under the history and medication portions of their EMR.     ROS Negative, with the exception of above mentioned in HPI   Objective:  BP 114/79   Pulse 98   Temp 97.7 F (36.5 C)   Wt 133 lb 9.6 oz (60.6 kg)   SpO2 98%   BMI 22.23 kg/m  Body mass index is 22.23 kg/m. Physical Exam Vitals and nursing note reviewed.  Constitutional:      General: She is not in acute distress.    Appearance: Normal appearance. She is normal weight. She is not ill-appearing or toxic-appearing.  HENT:     Head: Normocephalic and atraumatic.  Eyes:     General: No scleral icterus.       Right eye: No discharge.        Left eye: No discharge.     Extraocular Movements: Extraocular movements intact.     Conjunctiva/sclera: Conjunctivae normal.     Pupils: Pupils are equal, round, and reactive to light.  Musculoskeletal:        General: Tenderness present.     Comments: Right shoulder: No erythema.  Tender to palpation over supraspinatus, deltoid  and bicipital groove.  Discomfort with empty cans test.  Discomfort with Hawkins.  Discomfort with resisted flexion.  Neurovascular intact distally  Skin:    Findings: No rash.  Neurological:     Mental Status: She  is alert and oriented to person, place, and time. Mental status is at baseline.     Motor: No weakness.     Coordination: Coordination normal.     Gait: Gait normal.  Psychiatric:        Mood and Affect: Mood normal.        Behavior: Behavior normal.        Thought Content: Thought content normal.        Judgment: Judgment normal.     No results found. No results found. No results found for this or any previous visit (from the past 24 hour(s)).  Assessment/Plan: Samantha Lyons is a 52 y.o. female present for OV for  Rotator cuff strain, right, initial encounter Suspect rotator cuff impingement/strain. Naproxen twice daily x 7 days 14 days with food. Robaxin 3 times daily as needed Follow-up in 4 weeks if needed, would need to consider orthopedic referral at that time.  Reviewed expectations re: course of current medical issues. Discussed self-management of symptoms. Outlined signs and symptoms indicating need for more acute intervention. Patient verbalized understanding and all questions were answered. Patient received an After-Visit Summary.    No orders of the defined types were placed in this encounter.  No orders of the defined types were placed in this encounter.  Referral Orders  No referral(s) requested today     Note is dictated utilizing voice recognition software. Although note has been proof read prior to signing, occasional typographical errors still can be missed. If any questions arise, please do not hesitate to call for verification.   electronically signed by:  Felix Pacini, DO  [facility] [place]

## 2022-11-16 NOTE — Telephone Encounter (Signed)
Patient forgot to ask about her referral to a sleep study. While looking in the patients chart it looks like one was place in March, but was denied. Please give the patient a call to discuss the sleep study.

## 2022-11-16 NOTE — Telephone Encounter (Signed)
Sent message to [employer].

## 2022-12-08 NOTE — Telephone Encounter (Signed)
Good afternoon. Have you seen any new updates regarding pts referral to [facility] that was placed on 5/15?

## 2022-12-08 NOTE — Telephone Encounter (Signed)
Samantha Lyons called and is asking if a letter with the referral information can be mailed to her. She is asking that the phone number address etc be included in the letter.

## 2022-12-19 NOTE — Telephone Encounter (Signed)
Patient called regarding referral.  She states that she has called [facility] and they told her they did not do sleep studies.  After review, patient is treated by therapist and they are requesting a further evaluation to rule out other sleep disorders. 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  They declined to see patient because they do not see patient's with diagnosis of sleep disorder.  Referral was changed to [facility]. I gave patient phone number to call to schedule appt with [facility].  Patient is scheduled with Dr. Lolly Mustache on 01/17/23

## 2022-12-29 ENCOUNTER — Encounter: Payer: Self-pay | Admitting: Family Medicine

## 2022-12-29 ENCOUNTER — Ambulatory Visit: Admitting: Family Medicine

## 2022-12-29 VITALS — BP 111/78 | HR 97 | Temp 97.9°F | Wt 130.2 lb

## 2022-12-29 DIAGNOSIS — M25511 Pain in right shoulder: Secondary | ICD-10-CM | POA: Diagnosis not present

## 2022-12-29 DIAGNOSIS — G8929 Other chronic pain: Secondary | ICD-10-CM

## 2022-12-29 MED ORDER — MELOXICAM 15 MG PO TABS: 15.0000 mg | ORAL_TABLET | Freq: Every day | ORAL | 1 refills | Status: AC

## 2022-12-29 NOTE — Progress Notes (Signed)
Samantha Lyons , [birth year], 52 y.o., female MRN: [id number] Patient Care Team    Relationship Specialty Notifications Start End  Natalia Leatherwood, DO PCP - General Family Medicine  07/27/21   [facility] Consulting Physician Gastroenterology  07/05/15   Marcelle Overlie, MD Consulting Physician Obstetrics and Gynecology  07/27/21     Chief Complaint  Patient presents with   Joint Pain    Right shoulder, knees and hip pain; 6 months  Scheduled for 07/03 for abdominal pain;     Subjective: Samantha Lyons is a 52 y.o. Pt presents for follow-up on arm pain.  Patient reports continued right shoulder pain despite conservative treatment and NSAID use.  Pain has been present now proximately 6-8 weeks.  Pain is worse with using the right arm and laying on her right arm.  No prior injury noted.  No surgical procedures completed on right shoulder.  Prior note: An OV with complaints of right shoulder pain  of 1 week duration.  Associated symptoms include pain with right arm movement and laying on her right arm.. She denies injury prior to onset of symptoms.  She denies prior injury or surgical procedure on her right shoulder. Pt has tried nothing to ease their symptoms.      07/29/2022    2:37 PM  Depression screen PHQ 2/9  Decreased Interest 3  Down, Depressed, Hopeless 2  PHQ - 2 Score 5  Altered sleeping 3  Tired, decreased energy 0  Change in appetite 0  Feeling bad or failure about yourself  1  Trouble concentrating 1  Moving slowly or fidgety/restless 3  Suicidal thoughts 0  PHQ-9 Score 13    No Known Allergies Social History   Social History Narrative   Married. High school grad.   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    Smoker. Herbal remedies, seat belts.   Smoke alarm in the home.    Guns in the home (in a locked cabinet)   Feels safe in relationships   Past Medical History:  Diagnosis Date   Bipolar disorder, unspecified  (HCC)    Bright red blood per rectum    Cholelithiasis    Chronic fatigue syndrome    Colitis    Depression    Eating disorder    Endometriosis    Fecal incontinence 04/24/2015   Female hypogonadism    Generalized anxiety disorder    GERD (gastroesophageal reflux disease)    History of chicken pox    Low testosterone level in female    05/08/2014   Marijuana use 10/06/2015   - Positive THC in ED - THC negative 10/07/2015, UDS negative   Seasonal allergies    Tobacco use    Tubular adenoma 05/20/2017   Past Surgical History:  Procedure Laterality Date   ABDOMINAL HYSTERECTOMY  10/23/2008   ABDOMINOPLASTY     BARTHOLIN GLAND CYST EXCISION     BIOPSY  05/09/2017   Procedure: BIOPSY;  Surgeon: West Bali, MD;  [facility];  Service: Endoscopy;;  random colon;   CESAREAN SECTION  2000   CHOLECYSTECTOMY N/A 01/27/2014   Procedure: LAPAROSCOPIC CHOLECYSTECTOMY;  Surgeon: Dalia Heading, MD;  Location: [facility];  Service: General;  Laterality: N/A;   COLONOSCOPY WITH PROPOFOL N/A 05/09/2017   Procedure: COLONOSCOPY WITH PROPOFOL;  Surgeon: West Bali, MD;  [facility];  Service: Endoscopy;  Laterality: N/A;   TONSILLECTOMY  Family History  Problem Relation Age of Onset   Colon polyps Mother    Osteoporosis Mother    Arthritis Mother        OA   Stroke Mother    Hypertension Mother    Depression Mother    Miscarriages / [place] Mother    Colon polyps Father    Hypertension Father    Heart disease Father    Osteoporosis Father    Alzheimer's disease Father    Hepatitis Brother    Multiple sclerosis Brother    Stroke Brother    Stomach cancer Brother    Colon cancer Maternal Grandmother    Alzheimer's disease Paternal Grandmother    Inflammatory bowel disease Neg Hx    Crohn's disease Neg Hx    Esophageal cancer Neg Hx    Rectal cancer Neg Hx    Allergies as of 12/29/2022   No Known Allergies      Medication List         Accurate as of December 29, 2022 11:59 PM. If you have any questions, ask your nurse or doctor.          STOP taking these medications    methocarbamol 500 MG tablet Commonly known as: ROBAXIN Stopped by: Felix Pacini, DO   naproxen 500 MG tablet Commonly known as: Naprosyn Stopped by: Felix Pacini, DO       TAKE these medications    atomoxetine 100 MG capsule Commonly known as: STRATTERA   clonazePAM 1 MG tablet Commonly known as: KLONOPIN Take 1 tablet by mouth 2 (two) times daily as needed for anxiety.   cloNIDine HCl 0.1 MG Tb12 ER tablet Commonly known as: KAPVAY PLEASE SEE ATTACHED FOR DETAILED DIRECTIONS   estradiol 0.5 MG tablet Commonly known as: ESTRACE   eszopiclone 1 MG Tabs tablet Commonly known as: LUNESTA Take 1 mg by mouth at bedtime.   gabapentin 100 MG capsule Commonly known as: NEURONTIN 1 TO 3 AT BEDTIME AS NEEDED INSOMNIA   hydrOXYzine 25 MG capsule Commonly known as: VISTARIL Take 25 mg by mouth at bedtime.   INTUNIV PO one qd   lamoTRIgine 200 MG tablet Commonly known as: LAMICTAL Take 200 mg by mouth daily.   meloxicam 15 MG tablet Commonly known as: MOBIC Take 1 tablet (15 mg total) by mouth daily. Started by: Felix Pacini, DO   prazosin 2 MG capsule Commonly known as: MINIPRESS Take 2 mg by mouth at bedtime.   progesterone 100 MG capsule Commonly known as: PROMETRIUM Take 200 mg by mouth daily.   traZODone 100 MG tablet Commonly known as: DESYREL TAKE 1 TABLET BY MOUTH EVERYDAY AT BEDTIME   Vraylar 3 MG capsule Generic drug: cariprazine Take 3 mg by mouth at bedtime.        All past medical history, surgical history, allergies, family history, immunizations andmedications were updated in the EMR today and reviewed under the history and medication portions of their EMR.     ROS Negative, with the exception of above mentioned in HPI   Objective: BP 111/78   Pulse 97   Temp 97.9 F (36.6 C)   Wt 130 lb 3.2 oz  (59.1 kg)   SpO2 97%   BMI 21.67 kg/m  Body mass index is 21.67 kg/m. Physical Exam Vitals and nursing note reviewed.  Constitutional:      General: She is not in acute distress.    Appearance: Normal appearance. She is normal weight. She is not ill-appearing or toxic-appearing.  HENT:     Head: Normocephalic and atraumatic.  Eyes:     General: No scleral icterus.       Right eye: No discharge.        Left eye: No discharge.     Extraocular Movements: Extraocular movements intact.     Conjunctiva/sclera: Conjunctivae normal.     Pupils: Pupils are equal, round, and reactive to light.  Musculoskeletal:        General: Tenderness present.     Comments: Right shoulder: No erythema.  Tender to palpation over supraspinatus, deltoid and bicipital groove.  Discomfort with empty cans test.  Discomfort with Hawkins.  Discomfort with resisted flexion.  Neurovascular intact distally  Skin:    Findings: No rash.  Neurological:     Mental Status: She is alert and oriented to person, place, and time. Mental status is at baseline.     Motor: No weakness.     Coordination: Coordination normal.     Gait: Gait normal.  Psychiatric:        Mood and Affect: Mood normal.        Behavior: Behavior normal.        Thought Content: Thought content normal.        Judgment: Judgment normal.     No results found. No results found. No results found for this or any previous visit (from the past 24 hour(s)).  Assessment/Plan: Samantha Lyons is a 52 y.o. female present for OV for  Rotator cuff injury, right/shoulder pain Suspect rotator injury. Mobic 15 mg every day with meal Referral to ortho placed Follow-up as needed   Reviewed expectations re: course of current medical issues. Discussed self-management of symptoms. Outlined signs and symptoms indicating need for more acute intervention. Patient verbalized understanding and all questions were answered. Patient received an After-Visit  Summary.    Orders Placed This Encounter  Procedures   Ambulatory referral to Orthopedic Surgery   Meds ordered this encounter  Medications   meloxicam (MOBIC) 15 MG tablet    Sig: Take 1 tablet (15 mg total) by mouth daily.    Dispense:  90 tablet    Refill:  1   Referral Orders         Ambulatory referral to Orthopedic Surgery       Note is dictated utilizing voice recognition software. Although note has been proof read prior to signing, occasional typographical errors still can be missed. If any questions arise, please do not hesitate to call for verification.   electronically signed by:  Felix Pacini, DO  [facility] [place]

## 2022-12-29 NOTE — Patient Instructions (Signed)
[facility] referral placed for shoulder Mobic once daily with food.         Great to see you today.

## 2023-01-04 ENCOUNTER — Ambulatory Visit: Admitting: Family Medicine

## 2023-01-04 ENCOUNTER — Encounter: Payer: Self-pay | Admitting: Family Medicine

## 2023-01-04 VITALS — BP 117/81 | HR 101 | Temp 97.4°F | Ht 65.0 in | Wt 130.4 lb

## 2023-01-04 DIAGNOSIS — R1030 Lower abdominal pain, unspecified: Secondary | ICD-10-CM | POA: Diagnosis not present

## 2023-01-04 DIAGNOSIS — S39011A Strain of muscle, fascia and tendon of abdomen, initial encounter: Secondary | ICD-10-CM | POA: Diagnosis not present

## 2023-01-04 LAB — POC URINALSYSI DIPSTICK (AUTOMATED)
Bilirubin, UA: NEGATIVE
Blood, UA: NEGATIVE
Glucose, UA: NEGATIVE
Ketones, UA: NEGATIVE
Nitrite, UA: NEGATIVE
Protein, UA: POSITIVE — AB
Spec Grav, UA: 1.02 (ref 1.010–1.025)
Urobilinogen, UA: NEGATIVE E.U./dL — AB
pH, UA: 6 (ref 5.0–8.0)

## 2023-01-04 NOTE — Patient Instructions (Addendum)
Return if symptoms worsen or fail to improve.        Great to see you today.  I have refilled the medication(s) we provide.   If labs were collected, we will inform you of lab results once received either by echart message or telephone call.   - echart message- for normal results that have been seen by the patient already.   - telephone call: abnormal results or if patient has not viewed results in their echart.  

## 2023-01-04 NOTE — Progress Notes (Signed)
Samantha Lyons , 11-09-1970, 52 y.o., female MRN: [id number] Patient Care Team    Relationship Specialty Notifications Start End  Natalia Leatherwood, DO PCP - General Family Medicine  07/27/21   [facility] Consulting Physician Gastroenterology  07/05/15   Marcelle Overlie, MD Consulting Physician Obstetrics and Gynecology  07/27/21     Chief Complaint  Patient presents with   Abdominal Pain    3 weeks, Pt states she does not remember exactly what happened to cause pain. Lower stomach; says she does experience pain when urinating or having a BM.      Subjective: Samantha Lyons is a 52 y.o. Pt presents for an OV with complaints of lower abdominal pain of 3 weeks duration.  Associated symptoms include dysuria (pressure) and pain with BM.  Patient reports her bowel movements have not changed for her.  No melena.  She denies fever, chills or nausea.  She has had a total hysterectomy with bilateral salpingectomy and oophorectomy. Recent colonoscopy in April did not show any evidence of diverticulosis.  She did have 4 polyps biopsied. She does not recall an injury.  But she does admit over the last couple days it is improving. She states her dog stepped on her abdomen the other day and it reproduce the pain as well.     01/04/2023    1:27 PM 07/29/2022    2:37 PM  Depression screen PHQ 2/9  Decreased Interest 3 3  Down, Depressed, Hopeless 1 2  PHQ - 2 Score 4 5  Altered sleeping 3 3  Tired, decreased energy 2 0  Change in appetite 2 0  Feeling bad or failure about yourself  2 1  Trouble concentrating 2 1  Moving slowly or fidgety/restless 3 3  Suicidal thoughts 0 0  PHQ-9 Score 18 13  Difficult doing work/chores Somewhat difficult     No Known Allergies Social History   Social History Narrative   Married. High school grad.   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    Smoker. Herbal remedies, seat belts.   Smoke alarm in the home.     Guns in the home (in a locked cabinet)   Feels safe in relationships   Past Medical History:  Diagnosis Date   Bipolar disorder, unspecified (HCC)    Bright red blood per rectum    Cholelithiasis    Chronic fatigue syndrome    Colitis    Depression    Eating disorder    Endometriosis    Fecal incontinence 04/24/2015   Female hypogonadism    Generalized anxiety disorder    GERD (gastroesophageal reflux disease)    History of chicken pox    Low testosterone level in female    05/08/2014   Marijuana use 10/06/2015   - Positive THC in ED - THC negative 10/07/2015, UDS negative   Seasonal allergies    Tobacco use    Tubular adenoma 05/20/2017   Past Surgical History:  Procedure Laterality Date   ABDOMINAL HYSTERECTOMY  10/23/2008   ABDOMINOPLASTY     BARTHOLIN GLAND CYST EXCISION     BIOPSY  05/09/2017   Procedure: BIOPSY;  Surgeon: West Bali, MD;  [facility];  Service: Endoscopy;;  random colon;   CESAREAN SECTION  2000   CHOLECYSTECTOMY N/A 01/27/2014   Procedure: LAPAROSCOPIC CHOLECYSTECTOMY;  Surgeon: Dalia Heading, MD;  Location: [facility];  Service: General;  Laterality: N/A;  COLONOSCOPY WITH PROPOFOL N/A 05/09/2017   Procedure: COLONOSCOPY WITH PROPOFOL;  Surgeon: West Bali, MD;  [facility];  Service: Endoscopy;  Laterality: N/A;   TONSILLECTOMY     Family History  Problem Relation Age of Onset   Colon polyps Mother    Osteoporosis Mother    Arthritis Mother        OA   Stroke Mother    Hypertension Mother    Depression Mother    Miscarriages / [place] Mother    Colon polyps Father    Hypertension Father    Heart disease Father    Osteoporosis Father    Alzheimer's disease Father    Hepatitis Brother    Multiple sclerosis Brother    Stroke Brother    Stomach cancer Brother    Colon cancer Maternal Grandmother    Alzheimer's disease Paternal Grandmother    Inflammatory bowel disease Neg Hx    Crohn's disease Neg Hx     Esophageal cancer Neg Hx    Rectal cancer Neg Hx    Allergies as of 01/04/2023   No Known Allergies      Medication List        Accurate as of January 04, 2023  3:49 PM. If you have any questions, ask your nurse or doctor.          atomoxetine 100 MG capsule Commonly known as: STRATTERA   clonazePAM 1 MG tablet Commonly known as: KLONOPIN Take 1 tablet by mouth 2 (two) times daily as needed for anxiety.   cloNIDine HCl 0.1 MG Tb12 ER tablet Commonly known as: KAPVAY PLEASE SEE ATTACHED FOR DETAILED DIRECTIONS   estradiol 0.5 MG tablet Commonly known as: ESTRACE   eszopiclone 1 MG Tabs tablet Commonly known as: LUNESTA Take 1 mg by mouth at bedtime.   gabapentin 100 MG capsule Commonly known as: NEURONTIN 1 TO 3 AT BEDTIME AS NEEDED INSOMNIA   hydrOXYzine 25 MG capsule Commonly known as: VISTARIL Take 25 mg by mouth at bedtime.   INTUNIV PO one qd   lamoTRIgine 200 MG tablet Commonly known as: LAMICTAL Take 200 mg by mouth daily.   meloxicam 15 MG tablet Commonly known as: MOBIC Take 1 tablet (15 mg total) by mouth daily.   prazosin 2 MG capsule Commonly known as: MINIPRESS Take 2 mg by mouth at bedtime.   progesterone 100 MG capsule Commonly known as: PROMETRIUM Take 200 mg by mouth daily.   traZODone 100 MG tablet Commonly known as: DESYREL TAKE 1 TABLET BY MOUTH EVERYDAY AT BEDTIME   Vraylar 3 MG capsule Generic drug: cariprazine Take 3 mg by mouth at bedtime.        All past medical history, surgical history, allergies, family history, immunizations andmedications were updated in the EMR today and reviewed under the history and medication portions of their EMR.     ROS Negative, with the exception of above mentioned in HPI   Objective:  BP 117/81   Pulse (!) 101   Temp (!) 97.4 F (36.3 C)   Ht 5\' 5"  (1.651 m)   Wt 130 lb 6.4 oz (59.1 kg)   SpO2 98%   BMI 21.70 kg/m  Body mass index is 21.7 kg/m. Physical Exam Vitals and  nursing note reviewed.  Constitutional:      General: She is not in acute distress.    Appearance: Normal appearance. She is normal weight. She is not ill-appearing or toxic-appearing.  HENT:  Head: Normocephalic and atraumatic.  Eyes:     General: No scleral icterus.       Right eye: No discharge.        Left eye: No discharge.     Extraocular Movements: Extraocular movements intact.     Conjunctiva/sclera: Conjunctivae normal.     Pupils: Pupils are equal, round, and reactive to light.  Abdominal:     General: Abdomen is flat. Bowel sounds are normal. There is no distension.     Palpations: Abdomen is soft. There is no mass.     Tenderness: There is abdominal tenderness in the left lower quadrant. There is no right CVA tenderness, left CVA tenderness, guarding or rebound. Negative signs include Murphy's sign and McBurney's sign.     Hernia: No hernia is present.  Skin:    Findings: No rash.  Neurological:     Mental Status: She is alert and oriented to person, place, and time. Mental status is at baseline.     Motor: No weakness.     Coordination: Coordination normal.     Gait: Gait normal.  Psychiatric:        Mood and Affect: Mood normal.        Behavior: Behavior normal.        Thought Content: Thought content normal.        Judgment: Judgment normal.     No results found. No results found. Results for orders placed or performed in visit on 01/04/23 (from the past 24 hour(s))  POCT Urinalysis Dipstick (Automated)     Status: Abnormal   Collection Time: 01/04/23  1:52 PM  Result Value Ref Range   Color, UA yellow    Clarity, UA clear    Glucose, UA Negative Negative   Bilirubin, UA Negative    Ketones, UA Negative    Spec Grav, UA 1.020 1.010 - 1.025   Blood, UA negative    pH, UA 6.0 5.0 - 8.0   Protein, UA Positive (A) Negative   Urobilinogen, UA negative (A) 0.2 or 1.0 E.U./dL   Nitrite, UA negative    Leukocytes, UA Trace (A) Negative     Assessment/Plan: Samantha Lyons is a 52 y.o. female present for OV for  Lower abdominal pain/Strain of abdominal muscle, initial encounter - POCT Urinalysis Dipstick (Automated)-leuks - Urinalysis w microscopic + reflex cultur Patient exam is consistent with a muscle strain. Encouraged anti-inflammatories to help with discomfort if needed.  Avoid heavy lifting until completely healed. Sent urine for culture to be certain since there was a trace of leuks. Patient will be called with results and further plan discussed at that time if needed   Reviewed expectations re: course of current medical issues. Discussed self-management of symptoms. Outlined signs and symptoms indicating need for more acute intervention. Patient verbalized understanding and all questions were answered. Patient received an After-Visit Summary.    Orders Placed This Encounter  Procedures   Urinalysis w microscopic + reflex cultur   POCT Urinalysis Dipstick (Automated)   No orders of the defined types were placed in this encounter.  Referral Orders  No referral(s) requested today     Note is dictated utilizing voice recognition software. Although note has been proof read prior to signing, occasional typographical errors still can be missed. If any questions arise, please do not hesitate to call for verification.   electronically signed by:  Felix Pacini, DO  [facility] [place]

## 2023-01-05 LAB — URINALYSIS W MICROSCOPIC + REFLEX CULTURE
Bilirubin Urine: NEGATIVE
Nitrites, Initial: NEGATIVE

## 2023-01-05 LAB — CULTURE INDICATED

## 2023-01-06 ENCOUNTER — Telehealth: Payer: Self-pay | Admitting: Family Medicine

## 2023-01-06 LAB — URINALYSIS W MICROSCOPIC + REFLEX CULTURE
Glucose, UA: NEGATIVE
Hgb urine dipstick: NEGATIVE
Specific Gravity, Urine: 1.022 (ref 1.001–1.035)
pH: 6 (ref 5.0–8.0)

## 2023-01-06 LAB — URINE CULTURE
MICRO NUMBER:: 15162028
Result:: NO GROWTH
SPECIMEN QUALITY:: ADEQUATE

## 2023-01-06 NOTE — Telephone Encounter (Signed)
Please inform patient her urinalysis did not show evidence of bacterial infection.

## 2023-01-06 NOTE — Telephone Encounter (Signed)
Pt advised.

## 2023-01-17 ENCOUNTER — Ambulatory Visit (HOSPITAL_COMMUNITY): Payer: Self-pay | Admitting: Psychiatry

## 2023-04-04 ENCOUNTER — Other Ambulatory Visit: Payer: Self-pay | Admitting: Family Medicine

## 2023-06-07 ENCOUNTER — Telehealth: Payer: Self-pay

## 2023-06-07 NOTE — Telephone Encounter (Signed)
Pt states that she cannot go to ED today but will go tomorrow. Offered pt an appt in off and pt declined due to being told to go to ED by triage.  [facility] Day - Client TELEPHONE ADVICE RECORD [facility] Patient Name First: [facility] Last: Lyons Gender: Female DOB: 07-26-1970 Age: 52 Y 11 M 3 D Return Phone Number: [phone] (Primary), [phone] (Secondary) Address: City/ [place]/ [place]  [address] Client [facility] Day - Client Client Site [facility] - Day Provider Claiborne [place], [place] Contact Type Call Who Is Calling Patient / Member / Family / Caregiver Call Type Triage / Clinical Relationship To Patient Self Return Phone Number [phone] (Primary) Chief Complaint BREATHING - shortness of breath or sounds breathless Reason for Call Symptomatic / Request for Health Information Initial Comment Patient is experiencing shortness of breath. Translation No Nurse Assessment Nurse: Clarita Leber, RN, Deborah Date/Time (Eastern Time): 06/07/2023 9:38:42 AM Confirm and document reason for call. If symptomatic, describe symptoms. ---The caller states that she passed out when she was walking from her bed across the room. The caller states that she gets very short of breath when she walks a short distance. Does the patient have any new or worsening symptoms? ---Yes Will a triage be completed? ---Yes Related visit to physician within the last 2 weeks? ---Yes Does the PT have any chronic conditions? (i.e. diabetes, asthma, this includes High risk factors for pregnancy, etc.) ---Yes List chronic conditions. ---bipolar medications Is the patient pregnant or possibly pregnant? (Ask all females between the ages of 31-55) ---No Is this a behavioral health or substance abuse call? ---No Guidelines Guideline Title Affirmed Question Affirmed Notes Nurse Date/Time (Eastern Time) Breathing Difficulty [1]  MODERATE difficulty breathing (e.g., speaks in phrases, SOB even at rest, pulse 100-120) AND [2] NEW-onset Womble, RN, Gavin Pound 06/07/2023 9:40:43 AM PLEASE NOTE: All timestamps contained within this report are represented as [place] Standard Time. CONFIDENTIALTY NOTICE: This fax transmission is intended only for the addressee. It contains information that is legally privileged, confidential or otherwise protected from use or disclosure. If you are not the intended recipient, you are strictly prohibited from reviewing, disclosing, copying using or disseminating any of this information or taking any action in reliance on or regarding this information. If you have received this fax in error, please notify us immediately by telephone so that we can arrange for its return to [place]. Phone: [phone], Toll-Free: [phone], Fax: [phone] Page: 2 of 2 Call Id: [id number] Guidelines Guideline Title Affirmed Question Affirmed Notes Nurse Date/Time Lamount Cohen Time) or WORSE than normal Disp. Time Lamount Cohen Time) Disposition Final User 06/07/2023 9:34:03 AM Send to Urgent Ruta Hinds 06/07/2023 9:45:24 AM Go to ED Now Yes Clarita Leber, RN, Gavin Pound Final Disposition 06/07/2023 9:45:24 AM Go to ED Now Yes Clarita Leber, RN, Jetty Duhamel Disagree/Comply Comply Caller Understands Yes PreDisposition Call Doctor Care Advice Given Per Guideline GO TO ED NOW: * You need to be seen in the Emergency Department. * Another adult should drive. Referrals [facility] - ED

## 2023-06-16 ENCOUNTER — Ambulatory Visit (INDEPENDENT_AMBULATORY_CARE_PROVIDER_SITE_OTHER): Admitting: Family Medicine

## 2023-06-16 DIAGNOSIS — Z91199 Patient's noncompliance with other medical treatment and regimen due to unspecified reason: Secondary | ICD-10-CM

## 2023-06-16 NOTE — Progress Notes (Signed)
No show

## 2023-06-20 ENCOUNTER — Encounter: Payer: Self-pay | Admitting: Family Medicine

## 2023-06-22 ENCOUNTER — Encounter: Payer: Self-pay | Admitting: Family Medicine

## 2023-06-22 ENCOUNTER — Ambulatory Visit: Admitting: Family Medicine

## 2023-06-22 VITALS — BP 110/70 | HR 80 | Wt 129.0 lb

## 2023-06-22 DIAGNOSIS — M25561 Pain in right knee: Secondary | ICD-10-CM

## 2023-06-22 DIAGNOSIS — M150 Primary generalized (osteo)arthritis: Secondary | ICD-10-CM

## 2023-06-22 DIAGNOSIS — M25552 Pain in left hip: Secondary | ICD-10-CM

## 2023-06-22 DIAGNOSIS — M25551 Pain in right hip: Secondary | ICD-10-CM

## 2023-06-22 DIAGNOSIS — G8929 Other chronic pain: Secondary | ICD-10-CM

## 2023-06-22 DIAGNOSIS — M25562 Pain in left knee: Secondary | ICD-10-CM

## 2023-06-22 MED ORDER — DICLOFENAC SODIUM 75 MG PO TBEC: 75.0000 mg | DELAYED_RELEASE_TABLET | Freq: Two times a day (BID) | ORAL | 1 refills | Status: AC

## 2023-06-22 NOTE — Patient Instructions (Addendum)

## 2023-06-22 NOTE — Progress Notes (Signed)
Samantha Lyons , [birth year], 52 y.o., female MRN: [id number] Patient Care Team    Relationship Specialty Notifications Start End  Natalia Leatherwood, DO PCP - General Family Medicine  07/27/21   [facility] Consulting Physician Gastroenterology  07/05/15   Marcelle Overlie, MD Consulting Physician Obstetrics and Gynecology  07/27/21     Chief Complaint  Patient presents with   Hip Pain    Hip and knee pain for about 8 months.     Subjective: Samantha Lyons is a 52 y.o. Pt presents for an OV with complaints of bilateral hip and knee pain.  Patient reports knee pain has been years but worsening.  Her hip pain has been developing over the last 8 months.  She reports it hurts when she walks and gets up/changes positions.  She occasionally will take NSAIDs to help with the discomfort.  Her daughter is with her today and states that her mother is walking very slowly and looks uncomfortable. She was established with [facility] this year over the summer and had shoulder surgery. 12/2022 > [facility] referral for shoulder was placed.      06/22/2023   10:33 AM 01/04/2023    1:27 PM 07/29/2022    2:37 PM  Depression screen PHQ 2/9  Decreased Interest 2 3 3   Down, Depressed, Hopeless 2 1 2   PHQ - 2 Score 4 4 5   Altered sleeping 2 3 3   Tired, decreased energy 2 2 0  Change in appetite 2 2 0  Feeling bad or failure about yourself  2 2 1   Trouble concentrating 2 2 1   Moving slowly or fidgety/restless 2 3 3   Suicidal thoughts 2 0 0  PHQ-9 Score 18 18 13   Difficult doing work/chores Very difficult Somewhat difficult     No Known Allergies Social History   Social History Narrative   Married. High school grad.   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    Smoker. Herbal remedies, seat belts.   Smoke alarm in the home.    Guns in the home (in a locked cabinet)   Feels safe in relationships   Past Medical History:  Diagnosis Date   Bipolar  disorder, unspecified (HCC)    Bright red blood per rectum    Cholelithiasis    Chronic fatigue syndrome    Colitis    Depression    Eating disorder    Endometriosis    Fecal incontinence 04/24/2015   Female hypogonadism    Generalized anxiety disorder    GERD (gastroesophageal reflux disease)    History of chicken pox    Low testosterone level in female    05/08/2014   Marijuana use 10/06/2015   - Positive THC in ED - THC negative 10/07/2015, UDS negative   Seasonal allergies    Tobacco use    Tubular adenoma 05/20/2017   Past Surgical History:  Procedure Laterality Date   ABDOMINAL HYSTERECTOMY  10/23/2008   ABDOMINOPLASTY     BARTHOLIN GLAND CYST EXCISION     BIOPSY  05/09/2017   Procedure: BIOPSY;  Surgeon: West Bali, MD;  [facility];  Service: Endoscopy;;  random colon;   CESAREAN SECTION  2000   CHOLECYSTECTOMY N/A 01/27/2014   Procedure: LAPAROSCOPIC CHOLECYSTECTOMY;  Surgeon: Dalia Heading, MD;  Location: [facility];  Service: General;  Laterality: N/A;   COLONOSCOPY WITH PROPOFOL N/A 05/09/2017   Procedure: COLONOSCOPY WITH PROPOFOL;  Surgeon: Darrick Penna,  Darleene Cleaver, MD;  [facility];  Service: Endoscopy;  Laterality: N/A;   TONSILLECTOMY     Family History  Problem Relation Age of Onset   Colon polyps Mother    Osteoporosis Mother    Arthritis Mother        OA   Stroke Mother    Hypertension Mother    Depression Mother    Miscarriages / [place] Mother    Colon polyps Father    Hypertension Father    Heart disease Father    Osteoporosis Father    Alzheimer's disease Father    Hepatitis Brother    Multiple sclerosis Brother    Stroke Brother    Stomach cancer Brother    Colon cancer Maternal Grandmother    Alzheimer's disease Paternal Grandmother    Inflammatory bowel disease Neg Hx    Crohn's disease Neg Hx    Esophageal cancer Neg Hx    Rectal cancer Neg Hx    Allergies as of 06/22/2023   No Known Allergies       Medication List        Accurate as of June 22, 2023 10:47 AM. If you have any questions, ask your nurse or doctor.          atomoxetine 100 MG capsule Commonly known as: STRATTERA   clonazePAM 1 MG tablet Commonly known as: KLONOPIN Take 1 tablet by mouth 2 (two) times daily as needed for anxiety.   cloNIDine HCl 0.1 MG Tb12 ER tablet Commonly known as: KAPVAY PLEASE SEE ATTACHED FOR DETAILED DIRECTIONS   diclofenac 75 MG EC tablet Commonly known as: VOLTAREN Take 1 tablet (75 mg total) by mouth 2 (two) times daily.   estradiol 0.5 MG tablet Commonly known as: ESTRACE   eszopiclone 1 MG Tabs tablet Commonly known as: LUNESTA Take 1 mg by mouth at bedtime.   gabapentin 100 MG capsule Commonly known as: NEURONTIN 1 TO 3 AT BEDTIME AS NEEDED INSOMNIA   hydrOXYzine 25 MG capsule Commonly known as: VISTARIL Take 25 mg by mouth at bedtime.   INTUNIV PO one qd   lamoTRIgine 200 MG tablet Commonly known as: LAMICTAL Take 200 mg by mouth daily.   meloxicam 15 MG tablet Commonly known as: MOBIC Take 1 tablet (15 mg total) by mouth daily.   prazosin 2 MG capsule Commonly known as: MINIPRESS Take 2 mg by mouth at bedtime.   progesterone 100 MG capsule Commonly known as: PROMETRIUM Take 200 mg by mouth daily.   traZODone 100 MG tablet Commonly known as: DESYREL TAKE 1 TABLET BY MOUTH EVERYDAY AT BEDTIME   Vraylar 3 MG capsule Generic drug: cariprazine Take 3 mg by mouth at bedtime.        All past medical history, surgical history, allergies, family history, immunizations andmedications were updated in the EMR today and reviewed under the history and medication portions of their EMR.     ROS Negative, with the exception of above mentioned in HPI   Objective:  BP 110/70   Pulse 80   Wt 129 lb (58.5 kg)   SpO2 97%   BMI 21.47 kg/m  Body mass index is 21.47 kg/m.  Physical Exam Vitals and nursing note reviewed.  Constitutional:       General: She is not in acute distress.    Appearance: Normal appearance. She is normal weight. She is not ill-appearing or toxic-appearing.  HENT:     Head: Normocephalic and atraumatic.  Eyes:  General: No scleral icterus.       Right eye: No discharge.        Left eye: No discharge.     Extraocular Movements: Extraocular movements intact.     Conjunctiva/sclera: Conjunctivae normal.     Pupils: Pupils are equal, round, and reactive to light.  Musculoskeletal:     Right hip: No deformity. Decreased range of motion.     Left hip: No deformity. Decreased range of motion.     Right knee: Crepitus present. No swelling, effusion, erythema or bony tenderness. Normal range of motion. No tenderness.     Left knee: Crepitus present. No swelling, effusion, erythema or bony tenderness. Normal range of motion. No tenderness.     Comments: +FABRE b/l for hip pain.    Skin:    Findings: No rash.  Neurological:     Mental Status: She is alert and oriented to person, place, and time. Mental status is at baseline.     Motor: No weakness.     Coordination: Coordination normal.     Gait: Gait normal.  Psychiatric:        Mood and Affect: Mood normal.        Behavior: Behavior normal.        Thought Content: Thought content normal.        Judgment: Judgment normal.      No results found. No results found. No results found for this or any previous visit (from the past 24 hours).  Assessment/Plan: Samantha Lyons is a 52 y.o. female present for OV for  Primary osteoarthritis involving multiple joints (Primary)/Bilateral hip pain/Chronic pain of both knees We discussed NSAID treatment for OA.  She has tolerated NSAIDs in the past. Start diclofenac 75 mg twice daily with food. Discussed options for knee and hip arthritis are typically NSAIDs, viscous injections can be offered for her knees through orthopedic.  Lastly joint replacements.  She would like referral to orthopedics - Ambulatory  referral to Orthopedic Surgery  Reviewed expectations re: course of current medical issues. Discussed self-management of symptoms. Outlined signs and symptoms indicating need for more acute intervention. Patient verbalized understanding and all questions were answered. Patient received an After-Visit Summary.    No orders of the defined types were placed in this encounter.  Meds ordered this encounter  Medications   diclofenac (VOLTAREN) 75 MG EC tablet    Sig: Take 1 tablet (75 mg total) by mouth 2 (two) times daily.    Dispense:  180 tablet    Refill:  1   Referral Orders  No referral(s) requested today     Note is dictated utilizing voice recognition software. Although note has been proof read prior to signing, occasional typographical errors still can be missed. If any questions arise, please do not hesitate to call for verification.   electronically signed by:  Felix Pacini, DO  [facility] [place]

## 2023-08-02 ENCOUNTER — Encounter: Admitting: Family Medicine

## 2023-09-19 LAB — HM MAMMOGRAPHY

## 2024-08-27 ENCOUNTER — Encounter: Admitting: Family Medicine
